# Patient Record
Sex: Male | Born: 1965 | Race: White | Hispanic: No | Marital: Married | Smoking: Never smoker
Health system: Southern US, Community
[De-identification: ages and names within clinical notes are randomized; demographics above are authoritative.]

## PROBLEM LIST (undated history)

## (undated) DIAGNOSIS — E785 Hyperlipidemia, unspecified: Secondary | ICD-10-CM

## (undated) DIAGNOSIS — F419 Anxiety disorder, unspecified: Secondary | ICD-10-CM

## (undated) DIAGNOSIS — K219 Gastro-esophageal reflux disease without esophagitis: Secondary | ICD-10-CM

## (undated) DIAGNOSIS — I2510 Atherosclerotic heart disease of native coronary artery without angina pectoris: Secondary | ICD-10-CM

## (undated) DIAGNOSIS — E7800 Pure hypercholesterolemia, unspecified: Secondary | ICD-10-CM

## (undated) DIAGNOSIS — N529 Male erectile dysfunction, unspecified: Secondary | ICD-10-CM

## (undated) DIAGNOSIS — G4700 Insomnia, unspecified: Secondary | ICD-10-CM

## (undated) DIAGNOSIS — E119 Type 2 diabetes mellitus without complications: Secondary | ICD-10-CM

## (undated) DIAGNOSIS — I10 Essential (primary) hypertension: Secondary | ICD-10-CM

## (undated) DIAGNOSIS — F39 Unspecified mood [affective] disorder: Secondary | ICD-10-CM

## (undated) DIAGNOSIS — M5126 Other intervertebral disc displacement, lumbar region: Secondary | ICD-10-CM

## (undated) HISTORY — DX: Insomnia, unspecified: G47.00

## (undated) HISTORY — DX: Unspecified mood (affective) disorder: F39

## (undated) HISTORY — DX: Hyperlipidemia, unspecified: E78.5

---

## 1999-09-27 HISTORY — PX: SHOULDER SURGERY: SHX246

## 2003-05-12 ENCOUNTER — Encounter: Payer: Self-pay | Admitting: Orthopedic Surgery

## 2003-05-12 ENCOUNTER — Ambulatory Visit (HOSPITAL_COMMUNITY): Admission: RE | Admit: 2003-05-12 | Discharge: 2003-05-12 | Payer: Self-pay | Admitting: Orthopedic Surgery

## 2008-03-20 ENCOUNTER — Ambulatory Visit: Payer: Self-pay | Admitting: Internal Medicine

## 2008-04-04 ENCOUNTER — Emergency Department: Payer: Self-pay | Admitting: Emergency Medicine

## 2008-08-14 ENCOUNTER — Encounter
Admission: RE | Admit: 2008-08-14 | Discharge: 2008-08-14 | Payer: Self-pay | Admitting: Physical Medicine & Rehabilitation

## 2008-08-22 ENCOUNTER — Emergency Department: Payer: Self-pay | Admitting: Emergency Medicine

## 2008-11-06 ENCOUNTER — Ambulatory Visit: Payer: Self-pay | Admitting: Pain Medicine

## 2008-11-11 ENCOUNTER — Ambulatory Visit: Payer: Self-pay | Admitting: Pain Medicine

## 2008-11-19 ENCOUNTER — Ambulatory Visit: Payer: Self-pay | Admitting: Physician Assistant

## 2008-12-01 ENCOUNTER — Ambulatory Visit: Payer: Self-pay | Admitting: Pain Medicine

## 2009-03-31 ENCOUNTER — Ambulatory Visit: Payer: Self-pay | Admitting: Internal Medicine

## 2010-11-09 ENCOUNTER — Ambulatory Visit: Payer: Self-pay | Admitting: Internal Medicine

## 2013-10-24 ENCOUNTER — Ambulatory Visit: Payer: Self-pay

## 2013-10-24 LAB — RAPID STREP-A WITH REFLX: Micro Text Report: NEGATIVE

## 2013-10-24 LAB — RAPID INFLUENZA A&B ANTIGENS

## 2013-10-27 LAB — BETA STREP CULTURE(ARMC)

## 2014-01-06 ENCOUNTER — Ambulatory Visit: Payer: Self-pay

## 2015-01-05 ENCOUNTER — Emergency Department
Admit: 2015-01-05 | Disposition: A | Discharge status: Home / Self Care | Payer: Self-pay | Admitting: Emergency Medicine

## 2015-01-05 LAB — BASIC METABOLIC PANEL
ANION GAP: 9 (ref 7–16)
BUN: 16 mg/dL
CALCIUM: 9.1 mg/dL
CO2: 22 mmol/L
Chloride: 102 mmol/L
Creatinine: 0.74 mg/dL
EGFR (Non-African Amer.): >60
Glucose: 251 mg/dL — ABNORMAL HIGH
Potassium: 4.3 mmol/L
Sodium: 133 mmol/L — ABNORMAL LOW

## 2015-01-05 LAB — CBC
HCT: 48.9 % (ref 40.0–52.0)
HGB: 16.9 g/dL (ref 13.0–18.0)
MCH: 32.5 pg (ref 26.0–34.0)
MCHC: 34.6 g/dL (ref 32.0–36.0)
MCV: 94 fL (ref 80–100)
Platelet: 205 10*3/uL (ref 150–440)
RBC: 5.21 10*6/uL (ref 4.40–5.90)
RDW: 12.9 % (ref 11.5–14.5)
WBC: 7 10*3/uL (ref 3.8–10.6)

## 2015-06-20 ENCOUNTER — Ambulatory Visit
Admission: EM | Admit: 2015-06-20 | Discharge: 2015-06-20 | Disposition: A | Discharge status: Home / Self Care | Payer: Managed Care, Other (non HMO) | Attending: Family Medicine | Admitting: Family Medicine

## 2015-06-20 ENCOUNTER — Encounter: Payer: Self-pay | Admitting: Emergency Medicine

## 2015-06-20 ENCOUNTER — Ambulatory Visit: Payer: Managed Care, Other (non HMO)

## 2015-06-20 DIAGNOSIS — S40011A Contusion of right shoulder, initial encounter: Secondary | ICD-10-CM

## 2015-06-20 DIAGNOSIS — S46911A Strain of unspecified muscle, fascia and tendon at shoulder and upper arm level, right arm, initial encounter: Secondary | ICD-10-CM | POA: Diagnosis not present

## 2015-06-20 HISTORY — DX: Type 2 diabetes mellitus without complications: E11.9

## 2015-06-20 HISTORY — DX: Essential (primary) hypertension: I10

## 2015-06-20 MED ORDER — KETOROLAC TROMETHAMINE 60 MG/2ML IM SOLN
60.0000 mg | Freq: Once | INTRAMUSCULAR | Status: AC
  Administered 2015-06-20: 60 mg via INTRAMUSCULAR

## 2015-06-20 MED ORDER — HYDROCODONE-ACETAMINOPHEN 5-325 MG PO TABS
1.0000 | ORAL_TABLET | Freq: Three times a day (TID) | ORAL | Status: DC | PRN
Start: 2015-06-20 — End: 2015-12-20

## 2015-06-20 NOTE — Discharge Instructions (Signed)
Shoulder Sprain °A shoulder sprain is the result of damage to the tough, fiber-like tissues (ligaments) that help hold your shoulder in place. The ligaments may be stretched or torn. Besides the main shoulder joint (the ball and socket), there are several smaller joints that connect the bones in this area. A sprain usually involves one of those joints. Most often it is the acromioclavicular (or AC) joint. That is the joint that connects the collarbone (clavicle) and the shoulder blade (scapula) at the top point of the shoulder blade (acromion). °A shoulder sprain is a mild form of what is called a shoulder separation. Recovering from a shoulder sprain may take some time. For some, pain lingers for several months. Most people recover without long term problems. °CAUSES  °· A shoulder sprain is usually caused by some kind of trauma. This might be: °¨ Falling on an outstretched arm. °¨ Being hit hard on the shoulder. °¨ Twisting the arm. °· Shoulder sprains are more likely to occur in people who: °¨ Play sports. °¨ Have balance or coordination problems. °SYMPTOMS  °· Pain when you move your shoulder. °· Limited ability to move the shoulder. °· Swelling and tenderness on top of the shoulder. °· Redness or warmth in the shoulder. °· Bruising. °· A change in the shape of the shoulder. °DIAGNOSIS  °Your healthcare provider may: °· Ask about your symptoms. °· Ask about recent activity that might have caused those symptoms. °· Examine your shoulder. You may be asked to do simple exercises to test movement. The other shoulder will be examined for comparison. °· Order some tests that provide a look inside the body. They can show the extent of the injury. The tests could include: °¨ X-rays. °¨ CT (computed tomography) scan. °¨ MRI (magnetic resonance imaging) scan. °RISKS AND COMPLICATIONS °· Loss of full shoulder motion. °· Ongoing shoulder pain. °TREATMENT  °How long it takes to recover from a shoulder sprain depends on how  severe it was. Treatment options may include: °· Rest. You should not use the arm or shoulder until it heals. °· Ice. For 2 or 3 days after the injury, put an ice pack on the shoulder up to 4 times a day. It should stay on for 15 to 20 minutes each time. Wrap the ice in a towel so it does not touch your skin. °· Over-the-counter medicine to relieve pain. °· A sling or brace. This will keep the arm still while the shoulder is healing. °· Physical therapy or rehabilitation exercises. These will help you regain strength and motion. Ask your healthcare provider when it is OK to begin these exercises. °· Surgery. The need for surgery is rare with a sprained shoulder, but some people may need surgery to keep the joint in place and reduce pain. °HOME CARE INSTRUCTIONS  °· Ask your healthcare provider about what you should and should not do while your shoulder heals. °· Make sure you know how to apply ice to the correct area of your shoulder. °· Talk with your healthcare provider about which medications should be used for pain and swelling. °· If rehabilitation therapy will be needed, ask your healthcare provider to refer you to a therapist. If it is not recommended, then ask about at-home exercises. Find out when exercise should begin. °SEEK MEDICAL CARE IF:  °Your pain, swelling, or redness at the joint increases. °SEEK IMMEDIATE MEDICAL CARE IF:  °· You have a fever. °· You cannot move your arm or shoulder. °Document Released: 01/29/2009 Document   Revised: 12/05/2011 Document Reviewed: 01/29/2009 °Exit[employer]® Patient Information ©2015 [employer]. This information is not intended to replace advice given to you by your health care provider. Make sure you discuss any questions you have with your health care provider. ° °
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
## 2015-12-20 ENCOUNTER — Encounter: Payer: Self-pay | Admitting: Emergency Medicine

## 2015-12-20 ENCOUNTER — Emergency Department: Payer: Managed Care, Other (non HMO)

## 2015-12-20 ENCOUNTER — Emergency Department
Admission: EM | Admit: 2015-12-20 | Discharge: 2015-12-20 | Disposition: A | Discharge status: Home / Self Care | Payer: Managed Care, Other (non HMO) | Attending: Student | Admitting: Student

## 2015-12-20 DIAGNOSIS — E119 Type 2 diabetes mellitus without complications: Secondary | ICD-10-CM | POA: Insufficient documentation

## 2015-12-20 DIAGNOSIS — Z7984 Long term (current) use of oral hypoglycemic drugs: Secondary | ICD-10-CM | POA: Diagnosis not present

## 2015-12-20 DIAGNOSIS — Y9301 Activity, walking, marching and hiking: Secondary | ICD-10-CM | POA: Insufficient documentation

## 2015-12-20 DIAGNOSIS — S2231XA Fracture of one rib, right side, initial encounter for closed fracture: Secondary | ICD-10-CM | POA: Diagnosis not present

## 2015-12-20 DIAGNOSIS — Y9289 Other specified places as the place of occurrence of the external cause: Secondary | ICD-10-CM | POA: Diagnosis not present

## 2015-12-20 DIAGNOSIS — Y998 Other external cause status: Secondary | ICD-10-CM | POA: Diagnosis not present

## 2015-12-20 DIAGNOSIS — Z791 Long term (current) use of non-steroidal anti-inflammatories (NSAID): Secondary | ICD-10-CM | POA: Diagnosis not present

## 2015-12-20 DIAGNOSIS — Z79899 Other long term (current) drug therapy: Secondary | ICD-10-CM | POA: Insufficient documentation

## 2015-12-20 DIAGNOSIS — I10 Essential (primary) hypertension: Secondary | ICD-10-CM | POA: Insufficient documentation

## 2015-12-20 DIAGNOSIS — W010XXA Fall on same level from slipping, tripping and stumbling without subsequent striking against object, initial encounter: Secondary | ICD-10-CM | POA: Diagnosis not present

## 2015-12-20 DIAGNOSIS — S29001A Unspecified injury of muscle and tendon of front wall of thorax, initial encounter: Secondary | ICD-10-CM | POA: Diagnosis present

## 2015-12-20 MED ORDER — HYDROCODONE-ACETAMINOPHEN 5-325 MG PO TABS
2.0000 | ORAL_TABLET | Freq: Once | ORAL | Status: AC
  Administered 2015-12-20: 2 via ORAL
  Filled 2015-12-20: qty 2

## 2015-12-20 MED ORDER — HYDROCODONE-ACETAMINOPHEN 5-325 MG PO TABS: 1.0000 | ORAL_TABLET | ORAL | Status: DC | PRN

## 2015-12-20 NOTE — Discharge Instructions (Signed)
Rib Fracture A rib fracture is a break or crack in one of the bones of the ribs. The ribs are like a cage that goes around your upper chest. A broken or cracked rib is often painful, but most do not cause other problems. Most rib fractures heal on their own in 1-3 months. HOME CARE  Avoid activities that cause pain to the injured area. Protect your injured area.  Slowly increase activity as told by your doctor.  Take medicine as told by your doctor.  Put ice on the injured area for the first 1-2 days after you have been treated or as told by your doctor.  Put ice in a plastic bag.  Place a towel between your skin and the bag.  Leave the ice on for 15-20 minutes at a time, every 2 hours while you are awake.  Do deep breathing as told by your doctor. You may be told to:  Take deep breaths many times a day.  Cough many times a day while hugging a pillow.  Use a device (incentive spirometer) to perform deep breathing many times a day.  Drink enough fluids to keep your pee (urine) clear or pale yellow.   Do not wear a rib belt or binder. These do not allow you to breathe deeply. GET HELP RIGHT AWAY IF:   You have a fever.  You have trouble breathing.   You cannot stop coughing.  You cough up thick or bloody spit (mucus).   You feel sick to your stomach (nauseous), throw up (vomit), or have belly (abdominal) pain.   Your pain gets worse and medicine does not help.  MAKE SURE YOU:   Understand these instructions.  Will watch your condition.  Will get help right away if you are not doing well or get worse.   This information is not intended to replace advice given to you by your health care provider. Make sure you discuss any questions you have with your health care provider.   Document Released: 06/21/2008 Document Revised: 01/07/2013 Document Reviewed: 11/14/2012 Elsevier Interactive Patient Education 2016 [employer]   Continue taking ibuprofen for  inflammation. Norco as needed for severe pain.  Be aware that you cannot take pain medication while driving or working.  ice to area as needed for pain.  Use a pillow against your chest to take deep breaths and cough once an hour to prevent pneumonia

## 2015-12-20 NOTE — ED Notes (Signed)
Patient presents to the ED with right sided rib pain after tripping over a log yesterday evening.  Patient reports some pain when he breathes.  Patient is speaking in full sentences with no obvious distress.

## 2015-12-20 NOTE — ED Notes (Signed)
Patient transported to X-ray 

## 2015-12-20 NOTE — ED Notes (Signed)
Patient states rib pain started after 10pm when he tripped over a log and landed on his right side.  Patient c/o pain when he take a deep breath, but he is able to fully inhale and exhale.  No bruising noted at this time.  Patient points to right lower rib cage near his side as the area of discomfort.  Patient is in NAD at this time.

## 2015-12-20 NOTE — ED Provider Notes (Signed)
[facility] Emergency Department Provider Note  ____________________________________________  Time seen: Approximately 8:40 AM  I have reviewed the triage vital signs and the nursing notes.   HISTORY  Chief Complaint Fall   HPI Matthew Blevins is a 50 y.o. male is here complaining of right lateral chest wall pain. Patient states that he tripped over a long walk at approximately 10 PM last night and has had problems taking deep breaths and rib pain since that time.  He states he has not seen a bruise in the area. He should has never been a smoker in the past. Currently his pain is 10 over 10. Patient did take ibuprofen prior to his arrival in the emergency room.   Past Medical History  Diagnosis Date  . Diabetes mellitus without complication (HCC)   . Hypertension     There are no active problems to display for this patient.   Past Surgical History  Procedure Laterality Date  . Shoulder surgery Left 2001    tissue removed from "hood" of shoulder    Current Outpatient Rx  Name  Route  Sig  Dispense  Refill  . citalopram (CELEXA) 10 MG tablet   Oral   Take 10 mg by mouth daily.         Marland Kitchen. ibuprofen (ADVIL,MOTRIN) 800 MG tablet   Oral   Take 800 mg by mouth daily.         Marland Kitchen. lisinopril (PRINIVIL,ZESTRIL) 10 MG tablet   Oral   Take 10 mg by mouth daily.         . metFORMIN (GLUCOPHAGE) 500 MG tablet   Oral   Take 1,000 mg by mouth 2 (two) times daily with a meal.         . omeprazole (PRILOSEC) 20 MG capsule   Oral   Take 20 mg by mouth daily.         Marland Kitchen. HYDROcodone-acetaminophen (NORCO/VICODIN) 5-325 MG tablet   Oral   Take 1 tablet by mouth every 4 (four) hours as needed for moderate pain.   20 tablet   0     Allergies Review of patient's allergies indicates no known allergies.  No family history on file.  Social History Social History  Substance Use Topics  . Smoking status: Never Smoker   . Smokeless tobacco:  Never Used  . Alcohol Use: Yes    Review of Systems Constitutional: No fever/chills Cardiovascular: Denies chest pain. Respiratory: Denies shortness of breath. Gastrointestinal: No abdominal pain.  Positive nausea, no vomiting.   Musculoskeletal: Negative for back pain. Positive right lateral rib pain  Skin: Negative for rash. Neurological: Negative for headaches, focal weakness or numbness.  10-point ROS otherwise negative.  ____________________________________________   PHYSICAL EXAM:  VITAL SIGNS: ED Triage Vitals  Enc Vitals Group     BP 12/20/15 0757 133/74 mmHg     Pulse Rate 12/20/15 0757 98     Resp 12/20/15 0757 18     Temp 12/20/15 0757 97.8 F (36.6 C)     Temp Source 12/20/15 0757 Oral     SpO2 12/20/15 0757 100 %     Weight 12/20/15 0757 190 lb (86.183 kg)     Height 12/20/15 0757 5\' 10"  (1.778 m)     Head Cir --      Peak Flow --      Pain Score --      Pain Loc --      Pain Edu? --  Excl. in GC? --     Constitutional: Alert and oriented. Well appearing and in no acute distress. Eyes: Conjunctivae are normal. PERRL. EOMI. Head: Atraumatic. Nose: No congestion/rhinnorhea. Mouth/Throat: Mucous membranes are moist.  Oropharynx non-erythematous. Neck: No stridor.   Cardiovascular: Normal rate, regular rhythm. Grossly normal heart sounds.  Good peripheral circulation. Respiratory: Normal respiratory effort.  No retractions. Lungs CTAB.  There is marked tenderness on palpation of the right lateral lower ribs approximately 8, 9, 10 and 11. No deformity is noted and no crepitus. Gastrointestinal: Soft and nontender. Musculoskeletal: Moves upper and lower extremities without any difficulty. See note above in respiratory. Questionable rib fracture. Neurologic:  Normal speech and language. No gross focal neurologic deficits are appreciated. No gait instability. Skin:  Skin is warm, dry and intact. No ecchymosis, abrasions, erythema. Psychiatric: Mood and  affect are normal. Speech and behavior are normal.  ____________________________________________   LABS (all labs ordered are listed, but only abnormal results are displayed)  Labs Reviewed - No data to display   RADIOLOGY  Right rib and chest x-ray shows nondisplaced fracture of the right 11th rib per radiologist. I, Tommi Rumps, personally viewed and evaluated these images (plain radiographs) as part of my medical decision making, as well as reviewing the written report by the radiologist. ____________________________________________   PROCEDURES  Procedure(s) performed: None  Critical Care performed: No  ____________________________________________   INITIAL IMPRESSION / ASSESSMENT AND PLAN / ED COURSE  Pertinent labs & imaging results that were available during my care of the patient were reviewed by me and considered in my medical decision making (see chart for details).  Patient was made aware that he does have a rib fracture. He was placed on pain medication and told he could not take this to drive or work. He is also encouraged to take deep breaths every hour or place a pleasant over his ribs for support when he coughs or sneezes. Follow-up with his primary care doctor if any continued problems. ____________________________________________   FINAL CLINICAL IMPRESSION(S) / ED DIAGNOSES  Final diagnoses:  Closed fracture of rib of right side, initial encounter      Tommi Rumps, PA-C 12/20/15 1043  Gayla Doss, MD 12/20/15 1233

## 2016-08-28 ENCOUNTER — Encounter: Payer: Self-pay | Admitting: Gynecology

## 2016-08-28 ENCOUNTER — Ambulatory Visit
Admission: EM | Admit: 2016-08-28 | Discharge: 2016-08-28 | Disposition: A | Discharge status: Home / Self Care | Payer: Managed Care, Other (non HMO) | Attending: Family Medicine | Admitting: Family Medicine

## 2016-08-28 DIAGNOSIS — L03032 Cellulitis of left toe: Secondary | ICD-10-CM

## 2016-08-28 HISTORY — DX: Pure hypercholesterolemia, unspecified: E78.00

## 2016-08-28 HISTORY — DX: Gastro-esophageal reflux disease without esophagitis: K21.9

## 2016-08-28 HISTORY — DX: Other intervertebral disc displacement, lumbar region: M51.26

## 2016-08-28 HISTORY — DX: Male erectile dysfunction, unspecified: N52.9

## 2016-08-28 LAB — URINALYSIS COMPLETE WITH MICROSCOPIC (ARMC ONLY)
BACTERIA UA: NONE SEEN
Bilirubin Urine: NEGATIVE
GLUCOSE, UA: 500 mg/dL — AB
HGB URINE DIPSTICK: NEGATIVE
Ketones, ur: NEGATIVE mg/dL
Leukocytes, UA: NEGATIVE
NITRITE: NEGATIVE
Specific Gravity, Urine: 1.02 (ref 1.005–1.030)
pH: 6.5 (ref 5.0–8.0)

## 2016-08-28 LAB — GLUCOSE, CAPILLARY: GLUCOSE-CAPILLARY: 230 mg/dL — AB (ref 65–99)

## 2016-08-28 MED ORDER — CEPHALEXIN 500 MG PO CAPS: 500.0000 mg | ORAL_CAPSULE | Freq: Four times a day (QID) | ORAL | 0 refills | Status: DC

## 2016-08-28 NOTE — ED Triage Notes (Signed)
Patient c/o sugar high at home of 265. Patient also c/o of odor to urine and left big toe redness x 1 week.

## 2016-08-28 NOTE — ED Provider Notes (Signed)
[facility]    CSN: [id number] Arrival date & time: 08/28/16  1009     History   Chief Complaint Chief Complaint  Patient presents with  . Blood Sugar Problem  . Urinary Tract Infection  . Nail Problem    HPI Matthew Blevins is a 50 y.o. male.   50 yo diabetic male presents with a c/o left big toe pain for 2 weeks, since his dog stepped on his toe and scratched with his nail. Patient denies any fevers, chills, drainage. However, states his blood sugars have been high and urine smell has been very strong recently.    The history is provided by the patient.    Past Medical History:  Diagnosis Date  . Diabetes mellitus without complication (HCC)   . ED (erectile dysfunction)   . GERD (gastroesophageal reflux disease)   . High cholesterol   . Hypertension   . Lumbar herniated disc     There are no active problems to display for this patient.   Past Surgical History:  Procedure Laterality Date  . SHOULDER SURGERY Left 2001   tissue removed from "hood" of shoulder       Home Medications    Prior to Admission medications   Medication Sig Start Date End Date Taking? Authorizing Provider  citalopram (CELEXA) 10 MG tablet Take 10 mg by mouth daily.   Yes Historical Provider, MD  HYDROcodone-acetaminophen (NORCO/VICODIN) 5-325 MG tablet Take 1 tablet by mouth every 4 (four) hours as needed for moderate pain. 12/20/15  Yes Tommi Rumpshonda L Summers, PA-C  ibuprofen (ADVIL,MOTRIN) 800 MG tablet Take 800 mg by mouth daily.   Yes Historical Provider, MD  lisinopril (PRINIVIL,ZESTRIL) 10 MG tablet Take 10 mg by mouth daily.   Yes Historical Provider, MD  metFORMIN (GLUCOPHAGE) 500 MG tablet Take 1,000 mg by mouth 2 (two) times daily with a meal.   Yes Historical Provider, MD  omeprazole (PRILOSEC) 20 MG capsule Take 20 mg by mouth daily.   Yes Historical Provider, MD  cephALEXin (KEFLEX) 500 MG capsule Take 1 capsule (500 mg total) by mouth 4 (four) times daily. 08/28/16    Payton Mccallumrlando Demetrius Barrell, MD  simvastatin (ZOCOR) 40 MG tablet Take 40 mg by mouth daily.    Historical Provider, MD    Family History No family history on file.  Social History Social History  Substance Use Topics  . Smoking status: Never Smoker  . Smokeless tobacco: Never Used  . Alcohol use Yes     Allergies   Patient has no known allergies.   Review of Systems Review of Systems   Physical Exam Triage Vital Signs ED Triage Vitals  Enc Vitals Group     BP 08/28/16 1123 127/86     Pulse Rate 08/28/16 1123 78     Resp 08/28/16 1123 16     Temp 08/28/16 1123 98 F (36.7 C)     Temp Source 08/28/16 1123 Oral     SpO2 08/28/16 1123 99 %     Weight 08/28/16 1121 178 lb (80.7 kg)     Height 08/28/16 1121 5\' 8"  (1.727 m)     Head Circumference --      Peak Flow --      Pain Score 08/28/16 1123 0     Pain Loc --      Pain Edu? --      Excl. in GC? --    No data found.   Updated Vital Signs BP 127/86 (BP Location:  Left Arm)   Pulse 78   Temp 98 F (36.7 C) (Oral)   Resp 16   Ht 5\' 8"  (1.727 m)   Wt 178 lb (80.7 kg)   SpO2 99%   BMI 27.06 kg/m   Visual Acuity Right Eye Distance:   Left Eye Distance:   Bilateral Distance:    Right Eye Near:   Left Eye Near:    Bilateral Near:     Physical Exam  Constitutional: He appears well-developed and well-nourished. No distress.  Skin: He is not diaphoretic. There is erythema.  Left big toe skin with blanchable erythema, warmth and tenderness to palpation around the lateral nail edge; mild edema noted around the area as well; no drainage  Nursing note and vitals reviewed.    UC Treatments / Results  Labs (all labs ordered are listed, but only abnormal results are displayed) Labs Reviewed  URINALYSIS COMPLETEWITH MICROSCOPIC ([facility] ONLY) - Abnormal; Notable for the following:       Result Value   Glucose, UA 500 (*)    Protein, ur TRACE (*)    Squamous Epithelial / LPF 0-5 (*)    All other components within  normal limits  GLUCOSE, CAPILLARY - Abnormal; Notable for the following:    Glucose-Capillary 230 (*)    All other components within normal limits    EKG  EKG Interpretation None       Radiology No results found.  Procedures Procedures (including critical care time)  Medications Ordered in UC Medications - No data to display   Initial Impression / Assessment and Plan / UC Course  I have reviewed the triage vital signs and the nursing notes.  Pertinent labs & imaging results that were available during my care of the patient were reviewed by me and considered in my medical decision making (see chart for details).  Clinical Course       Final Clinical Impressions(s) / UC Diagnoses   Final diagnoses:  Cellulitis of left toe    New Prescriptions Discharge Medication List as of 08/28/2016 11:43 AM    START taking these medications   Details  cephALEXin (KEFLEX) 500 MG capsule Take 1 capsule (500 mg total) by mouth 4 (four) times daily., Starting Sun 08/28/2016, Normal       1. Lab (UA) results and diagnosis reviewed with patient 2. rx as per orders above; reviewed possible side effects, interactions, risks and benefits  3. Recommend supportive treatment with warm compresses to toe 4. Recommend follow up with PCP regarding uncontrolled diabetes (explained to patient that he's past due for a follow up with his PCP for his diabetes) 5. Follow-up prn if symptoms worsen or don't improve   Payton Mccallumrlando Palak Tercero, MD 08/28/16 1156

## 2017-03-13 ENCOUNTER — Encounter: Payer: Self-pay | Admitting: Emergency Medicine

## 2017-03-13 ENCOUNTER — Emergency Department
Admission: EM | Admit: 2017-03-13 | Discharge: 2017-03-13 | Disposition: A | Discharge status: Home / Self Care | Payer: Commercial Managed Care - PPO | Attending: Emergency Medicine | Admitting: Emergency Medicine

## 2017-03-13 ENCOUNTER — Emergency Department: Payer: Commercial Managed Care - PPO

## 2017-03-13 DIAGNOSIS — R079 Chest pain, unspecified: Secondary | ICD-10-CM | POA: Diagnosis not present

## 2017-03-13 DIAGNOSIS — Z7984 Long term (current) use of oral hypoglycemic drugs: Secondary | ICD-10-CM | POA: Diagnosis not present

## 2017-03-13 DIAGNOSIS — E119 Type 2 diabetes mellitus without complications: Secondary | ICD-10-CM | POA: Insufficient documentation

## 2017-03-13 DIAGNOSIS — Z791 Long term (current) use of non-steroidal anti-inflammatories (NSAID): Secondary | ICD-10-CM | POA: Insufficient documentation

## 2017-03-13 DIAGNOSIS — Z7982 Long term (current) use of aspirin: Secondary | ICD-10-CM | POA: Diagnosis not present

## 2017-03-13 DIAGNOSIS — I10 Essential (primary) hypertension: Secondary | ICD-10-CM | POA: Diagnosis not present

## 2017-03-13 LAB — HEPATIC FUNCTION PANEL
ALK PHOS: 69 U/L (ref 38–126)
ALT: 31 U/L (ref 17–63)
AST: 29 U/L (ref 15–41)
Albumin: 4.2 g/dL (ref 3.5–5.0)
BILIRUBIN TOTAL: 0.9 mg/dL (ref 0.3–1.2)
TOTAL PROTEIN: 7.4 g/dL (ref 6.5–8.1)

## 2017-03-13 LAB — CBC
HCT: 44.3 % (ref 40.0–52.0)
Hemoglobin: 15.7 g/dL (ref 13.0–18.0)
MCH: 32.5 pg (ref 26.0–34.0)
MCHC: 35.4 g/dL (ref 32.0–36.0)
MCV: 91.7 fL (ref 80.0–100.0)
PLATELETS: 195 10*3/uL (ref 150–440)
RBC: 4.83 MIL/uL (ref 4.40–5.90)
RDW: 12.8 % (ref 11.5–14.5)
WBC: 6.3 10*3/uL (ref 3.8–10.6)

## 2017-03-13 LAB — BASIC METABOLIC PANEL
Anion gap: 7 (ref 5–15)
BUN: 12 mg/dL (ref 6–20)
CHLORIDE: 100 mmol/L — AB (ref 101–111)
CO2: 25 mmol/L (ref 22–32)
Calcium: 9 mg/dL (ref 8.9–10.3)
Creatinine, Ser: 0.8 mg/dL (ref 0.61–1.24)
GFR calc Af Amer: >60 mL/min (ref >60)
GFR calc non Af Amer: >60 mL/min (ref >60)
GLUCOSE: 348 mg/dL — AB (ref 65–99)
Potassium: 4.1 mmol/L (ref 3.5–5.1)
SODIUM: 132 mmol/L — AB (ref 135–145)

## 2017-03-13 LAB — TROPONIN I
Troponin I: <0.03 ng/mL (ref <0.03)
Troponin I: <0.03 ng/mL (ref <0.03)

## 2017-03-13 LAB — BRAIN NATRIURETIC PEPTIDE: B Natriuretic Peptide: 26 pg/mL (ref 0.0–100.0)

## 2017-03-13 LAB — FIBRIN DERIVATIVES D-DIMER (ARMC ONLY): FIBRIN DERIVATIVES D-DIMER (ARMC): 290.8 (ref 0.00–499.00)

## 2017-03-13 LAB — LIPASE, BLOOD: Lipase: 30 U/L (ref 11–51)

## 2017-03-13 NOTE — Discharge Instructions (Signed)
Return to the emergency room for any new or worrisome symptoms including but not limited to chest pain, shortness of breath, or you feel worse in any way. We have advised either admission or close follow-up with cardiology tomorrow. He would prefer not to be admitted, we have given you instructions for cardiology follow-up. Please do so and follow up as well with pcp. Please return to the ED if you feel worse or have any other concerns

## 2017-03-13 NOTE — ED Provider Notes (Addendum)
[facility] Emergency Department Provider Note  ____________________________________________   I have reviewed the triage vital signs and the nursing notes.   HISTORY  Chief Complaint Chest Pain    HPI Matthew Blevins is a 51 y.o. male  who presents today complaining of right-sided chest pain. Happens when he is mowing the lawn and only when he is mowing the lawn. He does lots of exertional exercises otherwise at work and other places and has no chest pain but when he is pushing the lawnmower it is uncomfortable in his right chest. He denies any shortness of breath to me. No personal or family history of PE or DVT. No leg swelling no recent travel. Patient has no pain at this time. He can reproduce the pain by moving the wrong way however. Patient has had extensive history on that right side he did fall and break ribs there once, he has also been seen for a shoulder sprain on the right. He does not have any recollected trauma. However, when he is pushing a lawnmower it is uncomfortable on the right. He has no chest pain at this time. It stops when he stops mowing the lawn. Denies stated he otherwise has no exertional symptoms. He states that he does activities of equal exertional output with no discomfort. Pain does not radiate its a sharp pain, it is worse when he pushes the lawnmower and better when he stops. Nothing else makes it better or worse. He is not having any at this time. The radiation. No other associated symptoms.  Patient is a history of hypertension diabetes, he is not compliant with his metformin has normally elevated glucoses which he does not check   Past Medical History:  Diagnosis Date  . Diabetes mellitus without complication (HCC)   . ED (erectile dysfunction)   . GERD (gastroesophageal reflux disease)   . High cholesterol   . Hypertension   . Lumbar herniated disc     There are no active problems to display for this patient.   Past  Surgical History:  Procedure Laterality Date  . SHOULDER SURGERY Left 2001   tissue removed from "hood" of shoulder    Prior to Admission medications   Medication Sig Start Date End Date Taking? Authorizing Provider  cephALEXin (KEFLEX) 500 MG capsule Take 1 capsule (500 mg total) by mouth 4 (four) times daily. 08/28/16   Payton Mccallum, MD  citalopram (CELEXA) 10 MG tablet Take 10 mg by mouth daily.    [provider]  HYDROcodone-acetaminophen (NORCO/VICODIN) 5-325 MG tablet Take 1 tablet by mouth every 4 (four) hours as needed for moderate pain. 12/20/15   Tommi Rumps, PA-C  ibuprofen (ADVIL,MOTRIN) 800 MG tablet Take 800 mg by mouth daily.    [provider]  lisinopril (PRINIVIL,ZESTRIL) 10 MG tablet Take 10 mg by mouth daily.    [provider]  metFORMIN (GLUCOPHAGE) 500 MG tablet Take 1,000 mg by mouth 2 (two) times daily with a meal.    [provider]  omeprazole (PRILOSEC) 20 MG capsule Take 20 mg by mouth daily.    [provider]  simvastatin (ZOCOR) 40 MG tablet Take 40 mg by mouth daily.    [provider]    Allergies Patient has no known allergies.  No family history on file.  Social History Social History  Substance Use Topics  . Smoking status: Never Smoker  . Smokeless tobacco: Never Used  . Alcohol use Yes    Review  of Systems Constitutional: No fever/chills Eyes: No visual changes. ENT: No sore throat. No stiff neck no neck pain Cardiovascular: + chest pain. Respiratory: Denies shortness of breath. Gastrointestinal:   no vomiting.  No diarrhea.  No constipation. Genitourinary: Negative for dysuria. Musculoskeletal: Negative lower extremity swelling Skin: Negative for rash. Neurological: Negative for severe headaches, focal weakness or numbness.   ____________________________________________   PHYSICAL EXAM:  VITAL SIGNS: ED Triage Vitals  Enc Vitals Group     BP 03/13/17 1044 140/80      Pulse Rate 03/13/17 1044 78     Resp 03/13/17 1044 18     Temp 03/13/17 1044 97.6 F (36.4 C)     Temp Source 03/13/17 1044 Oral     SpO2 03/13/17 1044 99 %     Weight 03/13/17 1041 183 lb (83 kg)     Height 03/13/17 1041 5' 10.5" (1.791 m)     Head Circumference --      Peak Flow --      Pain Score 03/13/17 1041 10     Pain Loc --      Pain Edu? --      Excl. in GC? --     Constitutional: Alert and oriented. Well appearing and in no acute distress. Eyes: Conjunctivae are normal Head: Atraumatic HEENT: No congestion/rhinnorhea. Mucous membranes are moist.  Oropharynx non-erythematous Neck:   Nontender with no meningismus, no masses, no stridor Chest: Minimal tenderness to palpation or chest wall seems to reproduce his pain Cardiovascular: Normal rate, regular rhythm. Grossly normal heart sounds.  Good peripheral circulation. Respiratory: Normal respiratory effort.  No retractions. Lungs CTAB. Abdominal: Soft and nontender. No distention. No guarding no rebound Back:  There is no focal tenderness or step off.  there is no midline tenderness there are no lesions noted. there is no CVA tenderness Musculoskeletal: No lower extremity tenderness, no upper extremity tenderness. No joint effusions, no DVT signs strong distal pulses no edema Neurologic:  Normal speech and language. No gross focal neurologic deficits are appreciated.  Skin:  Skin is warm, dry and intact. No rash noted. Psychiatric: Mood and affect are normal. Speech and behavior are normal.  ____________________________________________   LABS (all labs ordered are listed, but only abnormal results are displayed)  Labs Reviewed  BASIC METABOLIC PANEL - Abnormal; Notable for the following:       Result Value   Sodium 132 (*)    Chloride 100 (*)    Glucose, Bld 348 (*)    All other components within normal limits  CBC  TROPONIN I  BRAIN NATRIURETIC PEPTIDE  LIPASE, BLOOD  HEPATIC FUNCTION PANEL  FIBRIN DERIVATIVES  D-DIMER ([facility] ONLY)   ____________________________________________  EKG  I personally interpreted any EKGs ordered by me or triage Normal sinus rhythm rate 74 bpm no acute ST elevation or acute ST depression normal axis ____________________________________________  RADIOLOGY  I reviewed any imaging ordered by me or triage that were performed during my shift and, if possible, patient and/or family made aware of any abnormal findings. ____________________________________________   PROCEDURES  Procedure(s) performed: None  Procedures  Critical Care performed: None  ____________________________________________   INITIAL IMPRESSION / ASSESSMENT AND PLAN / ED COURSE  Pertinent labs & imaging results that were available during my care of the patient were reviewed by me and considered in my medical decision making (see chart for details).  Patient has a history of chest pain with a certain kind of activity which involves using his right  upper arm. Other activities do not seem to elicit the chest pain. He does however have risk factors for CAD. We will send a second troponin, low suspicion for PE but we will send a d-dimer, this is right-sided pain although he does not have right-sided abdominal pain we'll do hepatic function panel and lipase. Chest x-ray is nonspecific. He denies cough, there is nothing to suggest that the patient has heart failure BNP is negative. We will continue to observe the patient here and sent second cardiac enzymes and reassess  ----------------------------------------- 3:30 PM on 03/13/2017 -----------------------------------------  Patient has no tenderness or pain at this time. He is eager to go home. I did discuss with him the fact that I cannot rule out heart disease with any of the tests that I have done a can only evaluate for damage to the heart. He understands this. He understands the need for a stress test. I have offered and recommended admission and  he would prefer to go home. I have offered and recommended close outpatient follow-up tomorrow and suggested that I can get him a work note and an appointment, he states that he would like a referral but would not need to make an appointment. Obviously, he had a sense risk benefits alternatives all these courses of action. Patient believes that he has pain from his old injury to his shoulder that is activated by mowing the lawn by pushing the mower and he states he is able to exert himself completely including playing golf and "running around" at work with no difficulty. He understands that if anytime he chooses to come back to the emergency department especially if he has pain or other symptoms she is encouraged to do so. Return precautions and follow-up given and understood.    ____________________________________________   FINAL CLINICAL IMPRESSION(S) / ED DIAGNOSES  Final diagnoses:  None      This chart was dictated using voice recognition software.  Despite best efforts to proofread,  errors can occur which can change meaning.      Jeanmarie PlantMcShane, Emry Tobin A, MD 03/13/17 1356    Jeanmarie PlantMcShane, Geo Slone A, MD 03/13/17 [id number]

## 2017-03-13 NOTE — ED Triage Notes (Signed)
Pt reports non radiating right side chest pain for three days. Pt reports feeling a tightening on his lungs and shortness of breath. Pt in no apparent distress in triage.

## 2017-06-25 IMAGING — CR DG RIBS W/ CHEST 3+V*R*
1 series · 3 of 3 positions shown · non-contrast
Comparison: None.

CLINICAL DATA: Pt reports falling over a log yesterday, c/o right
rib pain, TTP at BB, no bruising; shielded.

EXAM:
RIGHT RIBS AND CHEST - 3+ VIEW

[Series 1: dg ribs unilateral w/chest right · 0.14mm/px · 3 of 3 slices shown]
[im 1/3]
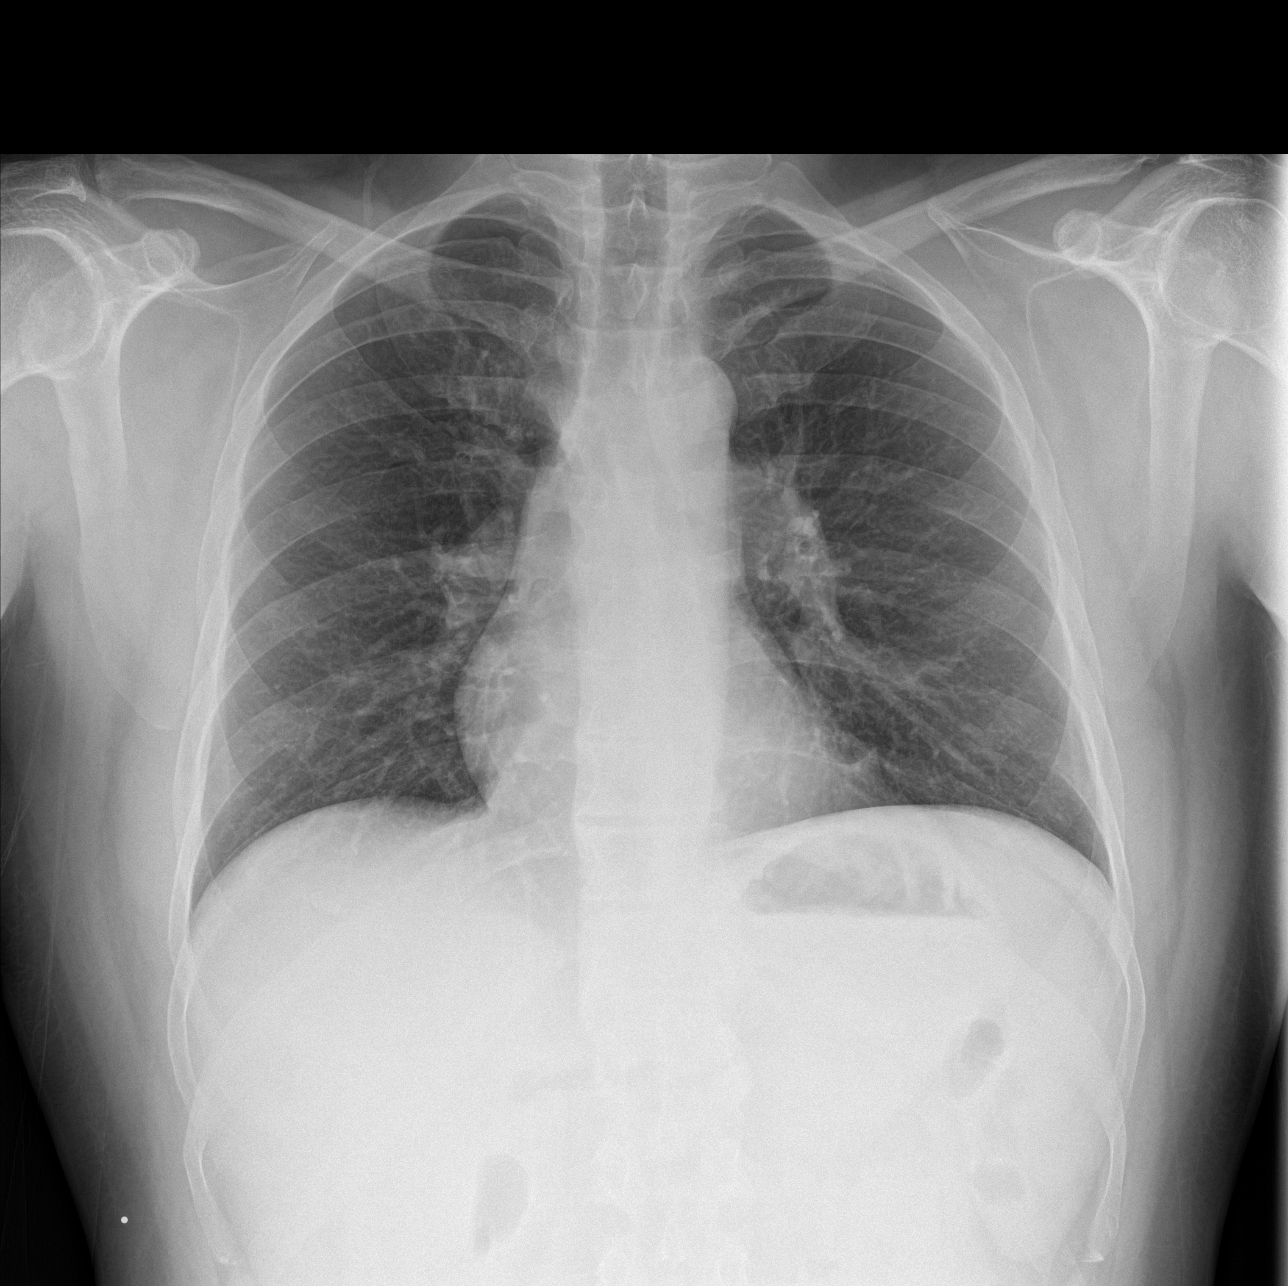
[im 2/3]
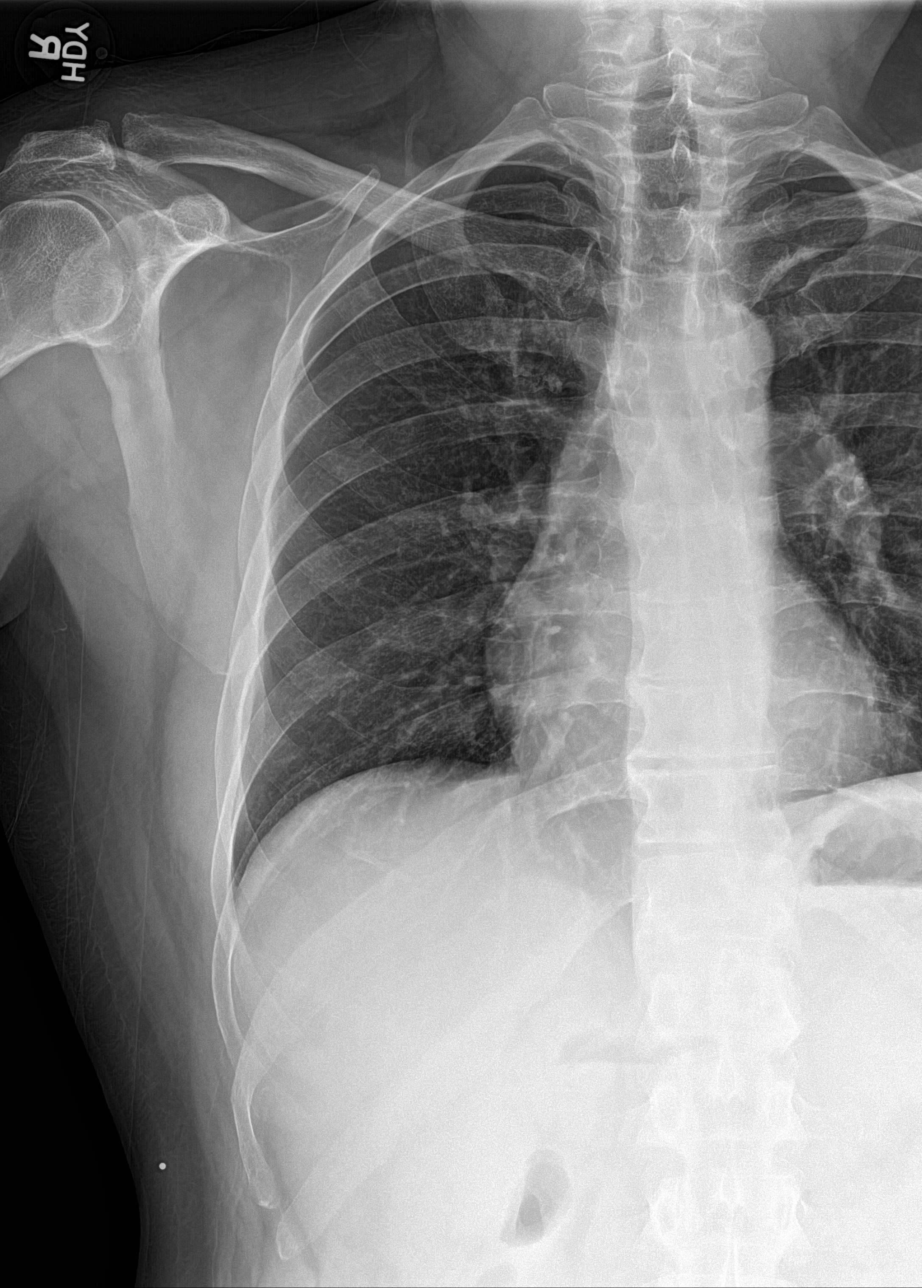
[im 3/3]
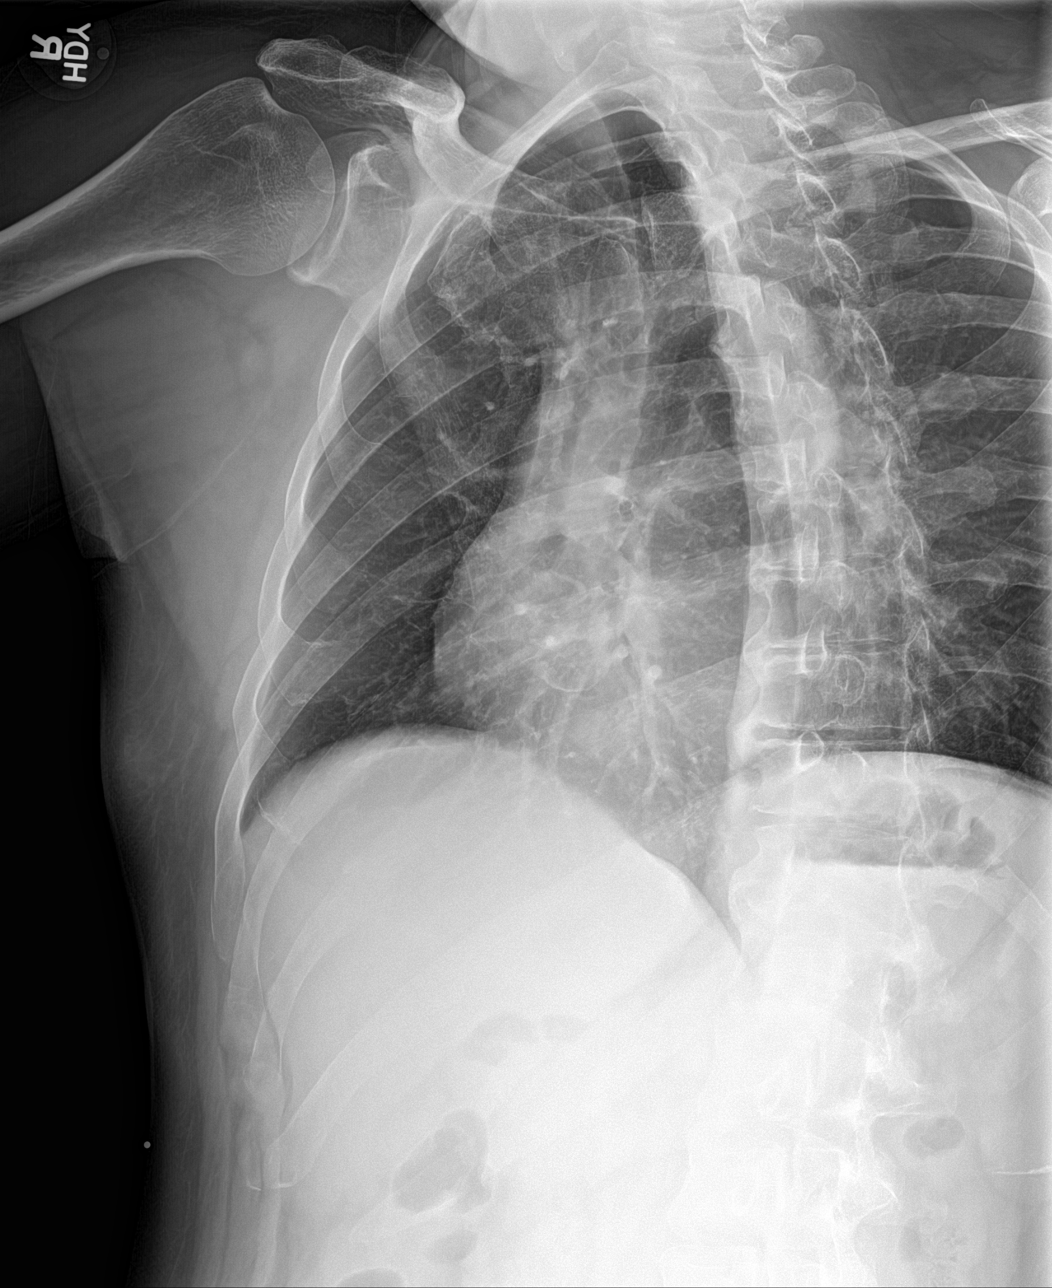

[3 of 3 positions shown; findings below may reference images not displayed]

FINDINGS: Normal cardiac silhouette. No pulmonary contusion or pneumothorax.
There is subtle angulation of the RIGHT lateral eleventh rib. This
could represent a nondisplaced fracture.
IMPRESSION: Potential nondisplaced fracture of the RIGHT eleventh rib. No
pneumothorax.

## 2019-04-09 ENCOUNTER — Other Ambulatory Visit: Payer: Self-pay

## 2019-04-09 ENCOUNTER — Inpatient Hospital Stay: Admit: 2019-04-09 | Payer: Commercial Managed Care - PPO

## 2019-04-09 ENCOUNTER — Encounter
Admission: EM | Disposition: A | Discharge status: Home / Self Care | Payer: Self-pay | Source: Home / Self Care | Attending: Internal Medicine

## 2019-04-09 ENCOUNTER — Inpatient Hospital Stay
Admission: EM | Admit: 2019-04-09 | Discharge: 2019-04-11 | DRG: 281 | Disposition: A | Discharge status: Home / Self Care | Payer: Commercial Managed Care - PPO | Attending: Internal Medicine | Admitting: Internal Medicine

## 2019-04-09 ENCOUNTER — Inpatient Hospital Stay (HOSPITAL_COMMUNITY)
Admit: 2019-04-09 | Discharge: 2019-04-09 | Disposition: A | Discharge status: Home / Self Care | Payer: Commercial Managed Care - PPO | Attending: Internal Medicine | Admitting: Internal Medicine

## 2019-04-09 ENCOUNTER — Emergency Department: Payer: Commercial Managed Care - PPO

## 2019-04-09 DIAGNOSIS — Z823 Family history of stroke: Secondary | ICD-10-CM

## 2019-04-09 DIAGNOSIS — I5030 Unspecified diastolic (congestive) heart failure: Secondary | ICD-10-CM | POA: Diagnosis not present

## 2019-04-09 DIAGNOSIS — E785 Hyperlipidemia, unspecified: Secondary | ICD-10-CM

## 2019-04-09 DIAGNOSIS — I10 Essential (primary) hypertension: Secondary | ICD-10-CM | POA: Diagnosis not present

## 2019-04-09 DIAGNOSIS — E781 Pure hyperglyceridemia: Secondary | ICD-10-CM | POA: Diagnosis present

## 2019-04-09 DIAGNOSIS — I110 Hypertensive heart disease with heart failure: Secondary | ICD-10-CM | POA: Diagnosis present

## 2019-04-09 DIAGNOSIS — I214 Non-ST elevation (NSTEMI) myocardial infarction: Principal | ICD-10-CM

## 2019-04-09 DIAGNOSIS — I5020 Unspecified systolic (congestive) heart failure: Secondary | ICD-10-CM | POA: Diagnosis present

## 2019-04-09 DIAGNOSIS — E876 Hypokalemia: Secondary | ICD-10-CM | POA: Diagnosis present

## 2019-04-09 DIAGNOSIS — R079 Chest pain, unspecified: Secondary | ICD-10-CM

## 2019-04-09 DIAGNOSIS — Z8249 Family history of ischemic heart disease and other diseases of the circulatory system: Secondary | ICD-10-CM

## 2019-04-09 DIAGNOSIS — Z794 Long term (current) use of insulin: Secondary | ICD-10-CM | POA: Diagnosis not present

## 2019-04-09 DIAGNOSIS — I25110 Atherosclerotic heart disease of native coronary artery with unstable angina pectoris: Secondary | ICD-10-CM | POA: Diagnosis present

## 2019-04-09 DIAGNOSIS — I2510 Atherosclerotic heart disease of native coronary artery without angina pectoris: Secondary | ICD-10-CM

## 2019-04-09 DIAGNOSIS — F39 Unspecified mood [affective] disorder: Secondary | ICD-10-CM | POA: Diagnosis present

## 2019-04-09 DIAGNOSIS — I252 Old myocardial infarction: Secondary | ICD-10-CM | POA: Diagnosis not present

## 2019-04-09 DIAGNOSIS — M5126 Other intervertebral disc displacement, lumbar region: Secondary | ICD-10-CM | POA: Diagnosis present

## 2019-04-09 DIAGNOSIS — E1165 Type 2 diabetes mellitus with hyperglycemia: Secondary | ICD-10-CM | POA: Diagnosis not present

## 2019-04-09 DIAGNOSIS — Z1159 Encounter for screening for other viral diseases: Secondary | ICD-10-CM | POA: Diagnosis not present

## 2019-04-09 DIAGNOSIS — Z79899 Other long term (current) drug therapy: Secondary | ICD-10-CM

## 2019-04-09 DIAGNOSIS — N529 Male erectile dysfunction, unspecified: Secondary | ICD-10-CM | POA: Diagnosis present

## 2019-04-09 DIAGNOSIS — K219 Gastro-esophageal reflux disease without esophagitis: Secondary | ICD-10-CM | POA: Diagnosis present

## 2019-04-09 DIAGNOSIS — Z9114 Patient's other noncompliance with medication regimen: Secondary | ICD-10-CM

## 2019-04-09 DIAGNOSIS — E782 Mixed hyperlipidemia: Secondary | ICD-10-CM | POA: Diagnosis not present

## 2019-04-09 HISTORY — DX: Anxiety disorder, unspecified: F41.9

## 2019-04-09 HISTORY — PX: LEFT HEART CATH AND CORONARY ANGIOGRAPHY: CATH118249

## 2019-04-09 HISTORY — DX: Atherosclerotic heart disease of native coronary artery without angina pectoris: I25.10

## 2019-04-09 LAB — CBC
HCT: 46.8 % (ref 39.0–52.0)
Hemoglobin: 17.1 g/dL — ABNORMAL HIGH (ref 13.0–17.0)
MCH: 32.7 pg (ref 26.0–34.0)
MCHC: 36.5 g/dL — ABNORMAL HIGH (ref 30.0–36.0)
MCV: 89.5 fL (ref 80.0–100.0)
Platelets: 204 10*3/uL (ref 150–400)
RBC: 5.23 MIL/uL (ref 4.22–5.81)
RDW: 11.7 % (ref 11.5–15.5)
WBC: 6 10*3/uL (ref 4.0–10.5)
nRBC: 0 % (ref 0.0–0.2)

## 2019-04-09 LAB — LIPID PANEL
Cholesterol: 305 mg/dL — ABNORMAL HIGH (ref 0–200)
HDL: 34 mg/dL — ABNORMAL LOW (ref >40)
LDL Cholesterol: UNDETERMINED mg/dL (ref 0–99)
Total CHOL/HDL Ratio: 9 RATIO
Triglycerides: 781 mg/dL — ABNORMAL HIGH (ref <150)
VLDL: UNDETERMINED mg/dL (ref 0–40)

## 2019-04-09 LAB — HEMOGLOBIN A1C
Hgb A1c MFr Bld: 11.4 % — ABNORMAL HIGH (ref 4.8–5.6)
Hgb A1c MFr Bld: 11.7 % — ABNORMAL HIGH (ref 4.8–5.6)
Mean Plasma Glucose: 280.48 mg/dL
Mean Plasma Glucose: 289.09 mg/dL

## 2019-04-09 LAB — URINE DRUG SCREEN, QUALITATIVE (ARMC ONLY)
Amphetamines, Ur Screen: NOT DETECTED
Barbiturates, Ur Screen: NOT DETECTED
Benzodiazepine, Ur Scrn: NOT DETECTED
Cannabinoid 50 Ng, Ur ~~LOC~~: NOT DETECTED
Cocaine Metabolite,Ur ~~LOC~~: NOT DETECTED
MDMA (Ecstasy)Ur Screen: NOT DETECTED
Methadone Scn, Ur: NOT DETECTED
Opiate, Ur Screen: NOT DETECTED
Phencyclidine (PCP) Ur S: NOT DETECTED
Tricyclic, Ur Screen: NOT DETECTED

## 2019-04-09 LAB — TROPONIN I (HIGH SENSITIVITY)
Troponin I (High Sensitivity): 134 ng/L (ref <18)
Troponin I (High Sensitivity): 163 ng/L (ref <18)
Troponin I (High Sensitivity): 159 ng/L (ref <18)

## 2019-04-09 LAB — SARS CORONAVIRUS 2 BY RT PCR (HOSPITAL ORDER, PERFORMED IN ~~LOC~~ HOSPITAL LAB): SARS Coronavirus 2: NEGATIVE

## 2019-04-09 LAB — BASIC METABOLIC PANEL
Anion gap: 13 (ref 5–15)
BUN: 9 mg/dL (ref 6–20)
CO2: 22 mmol/L (ref 22–32)
Calcium: 9.4 mg/dL (ref 8.9–10.3)
Chloride: 100 mmol/L (ref 98–111)
Creatinine, Ser: 0.63 mg/dL (ref 0.61–1.24)
GFR calc Af Amer: >60 mL/min (ref >60)
GFR calc non Af Amer: >60 mL/min (ref >60)
Glucose, Bld: 409 mg/dL — ABNORMAL HIGH (ref 70–99)
Potassium: 4 mmol/L (ref 3.5–5.1)
Sodium: 135 mmol/L (ref 135–145)

## 2019-04-09 LAB — GLUCOSE, CAPILLARY
Glucose-Capillary: 278 mg/dL — ABNORMAL HIGH (ref 70–99)
Glucose-Capillary: 218 mg/dL — ABNORMAL HIGH (ref 70–99)
Glucose-Capillary: 198 mg/dL — ABNORMAL HIGH (ref 70–99)
Glucose-Capillary: 383 mg/dL — ABNORMAL HIGH (ref 70–99)

## 2019-04-09 LAB — TSH: TSH: 1.446 u[IU]/mL (ref 0.350–4.500)

## 2019-04-09 LAB — LDL CHOLESTEROL, DIRECT: Direct LDL: 140.2 mg/dL — ABNORMAL HIGH (ref 0–99)

## 2019-04-09 SURGERY — LEFT HEART CATH AND CORONARY ANGIOGRAPHY
Anesthesia: Moderate Sedation

## 2019-04-09 MED ORDER — METOPROLOL TARTRATE 25 MG PO TABS: 25.0000 mg | ORAL_TABLET | Freq: Two times a day (BID) | ORAL | Status: DC

## 2019-04-09 MED ORDER — ASPIRIN 300 MG RE SUPP: 300.0000 mg | RECTAL | Status: DC

## 2019-04-09 MED ORDER — SODIUM CHLORIDE 0.9% FLUSH
3.0000 mL | Freq: Two times a day (BID) | INTRAVENOUS | Status: DC
  Administered 2019-04-09: 3 mL via INTRAVENOUS

## 2019-04-09 MED ORDER — HEPARIN (PORCINE) IN NACL 1000-0.9 UT/500ML-% IV SOLN
INTRAVENOUS | Status: AC
  Filled 2019-04-09: qty 1000

## 2019-04-09 MED ORDER — SODIUM CHLORIDE 0.9 % IV SOLN: 250.0000 mL | INTRAVENOUS | Status: DC | PRN

## 2019-04-09 MED ORDER — HEPARIN SODIUM (PORCINE) 1000 UNIT/ML IJ SOLN
INTRAMUSCULAR | Status: DC | PRN
  Administered 2019-04-09: 4000 [IU] via INTRAVENOUS

## 2019-04-09 MED ORDER — METOPROLOL TARTRATE 25 MG PO TABS
12.5000 mg | ORAL_TABLET | Freq: Two times a day (BID) | ORAL | Status: DC
  Filled 2019-04-09: qty 1

## 2019-04-09 MED ORDER — HEPARIN SODIUM (PORCINE) 1000 UNIT/ML IJ SOLN
INTRAMUSCULAR | Status: AC
  Filled 2019-04-09: qty 1

## 2019-04-09 MED ORDER — IOHEXOL 300 MG/ML  SOLN
INTRAMUSCULAR | Status: DC | PRN
  Administered 2019-04-09: 50 mL via INTRA_ARTERIAL

## 2019-04-09 MED ORDER — INSULIN ASPART 100 UNIT/ML ~~LOC~~ SOLN
0.0000 [IU] | Freq: Three times a day (TID) | SUBCUTANEOUS | Status: DC
  Administered 2019-04-10: 7 [IU] via SUBCUTANEOUS
  Administered 2019-04-10: 5 [IU] via SUBCUTANEOUS
  Administered 2019-04-10: 9 [IU] via SUBCUTANEOUS
  Administered 2019-04-11: 3 [IU] via SUBCUTANEOUS
  Administered 2019-04-11: 7 [IU] via SUBCUTANEOUS
  Filled 2019-04-09 (×5): qty 1

## 2019-04-09 MED ORDER — ONDANSETRON HCL 4 MG/2ML IJ SOLN: 4.0000 mg | Freq: Four times a day (QID) | INTRAMUSCULAR | Status: DC | PRN

## 2019-04-09 MED ORDER — MIDAZOLAM HCL 2 MG/2ML IJ SOLN
INTRAMUSCULAR | Status: DC | PRN
  Administered 2019-04-09: 1 mg via INTRAVENOUS

## 2019-04-09 MED ORDER — INSULIN ASPART 100 UNIT/ML ~~LOC~~ SOLN
5.0000 [IU] | Freq: Once | SUBCUTANEOUS | Status: AC
  Administered 2019-04-09: 11:00:00 5 [IU] via SUBCUTANEOUS
  Filled 2019-04-09: qty 1

## 2019-04-09 MED ORDER — HEPARIN BOLUS VIA INFUSION
4000.0000 [IU] | Freq: Once | INTRAVENOUS | Status: AC
  Administered 2019-04-09: 4000 [IU] via INTRAVENOUS
  Filled 2019-04-09: qty 4000

## 2019-04-09 MED ORDER — SODIUM CHLORIDE 0.9% FLUSH
3.0000 mL | Freq: Two times a day (BID) | INTRAVENOUS | Status: DC
  Administered 2019-04-09 – 2019-04-10 (×3): 3 mL via INTRAVENOUS

## 2019-04-09 MED ORDER — ASPIRIN EC 81 MG PO TBEC
81.0000 mg | DELAYED_RELEASE_TABLET | Freq: Every day | ORAL | Status: DC
  Administered 2019-04-10 – 2019-04-11 (×2): 81 mg via ORAL
  Filled 2019-04-09 (×2): qty 1

## 2019-04-09 MED ORDER — SODIUM CHLORIDE 0.9% FLUSH: 3.0000 mL | INTRAVENOUS | Status: DC | PRN

## 2019-04-09 MED ORDER — CLOPIDOGREL BISULFATE 75 MG PO TABS
75.0000 mg | ORAL_TABLET | Freq: Every day | ORAL | Status: DC
  Administered 2019-04-10 – 2019-04-11 (×2): 75 mg via ORAL
  Filled 2019-04-09 (×2): qty 1

## 2019-04-09 MED ORDER — SODIUM CHLORIDE 0.9 % IV SOLN
Freq: Once | INTRAVENOUS | Status: AC
  Administered 2019-04-09: 10:00:00 via INTRAVENOUS

## 2019-04-09 MED ORDER — MIDAZOLAM HCL 2 MG/2ML IJ SOLN
INTRAMUSCULAR | Status: AC
  Filled 2019-04-09: qty 2

## 2019-04-09 MED ORDER — LABETALOL HCL 5 MG/ML IV SOLN: 10.0000 mg | INTRAVENOUS | Status: AC | PRN

## 2019-04-09 MED ORDER — FENTANYL CITRATE (PF) 100 MCG/2ML IJ SOLN
INTRAMUSCULAR | Status: AC
  Filled 2019-04-09: qty 2

## 2019-04-09 MED ORDER — ALPRAZOLAM 0.25 MG PO TABS: 0.2500 mg | ORAL_TABLET | Freq: Two times a day (BID) | ORAL | Status: DC | PRN

## 2019-04-09 MED ORDER — ACETAMINOPHEN 325 MG PO TABS
650.0000 mg | ORAL_TABLET | ORAL | Status: DC | PRN
  Administered 2019-04-10: 650 mg via ORAL
  Filled 2019-04-09: qty 2

## 2019-04-09 MED ORDER — VERAPAMIL HCL 2.5 MG/ML IV SOLN
INTRAVENOUS | Status: DC | PRN
  Administered 2019-04-09: 2.5 mg via INTRA_ARTERIAL

## 2019-04-09 MED ORDER — HYDRALAZINE HCL 20 MG/ML IJ SOLN: 10.0000 mg | INTRAMUSCULAR | Status: AC | PRN

## 2019-04-09 MED ORDER — ENOXAPARIN SODIUM 40 MG/0.4ML ~~LOC~~ SOLN
40.0000 mg | SUBCUTANEOUS | Status: DC
  Filled 2019-04-09 (×3): qty 0.4

## 2019-04-09 MED ORDER — NITROGLYCERIN 0.4 MG SL SUBL: 0.4000 mg | SUBLINGUAL_TABLET | SUBLINGUAL | Status: DC | PRN

## 2019-04-09 MED ORDER — ASPIRIN 81 MG PO CHEW
324.0000 mg | CHEWABLE_TABLET | Freq: Once | ORAL | Status: AC
  Administered 2019-04-09: 324 mg via ORAL
  Filled 2019-04-09: qty 4

## 2019-04-09 MED ORDER — SODIUM CHLORIDE 0.9 % IV SOLN
INTRAVENOUS | Status: DC
  Administered 2019-04-09: 15:00:00 via INTRAVENOUS

## 2019-04-09 MED ORDER — INSULIN ASPART 100 UNIT/ML ~~LOC~~ SOLN
SUBCUTANEOUS | Status: AC
  Administered 2019-04-09: 5 [IU] via SUBCUTANEOUS
  Filled 2019-04-09: qty 1

## 2019-04-09 MED ORDER — ASPIRIN 81 MG PO CHEW
324.0000 mg | CHEWABLE_TABLET | ORAL | Status: DC
  Filled 2019-04-09: qty 4

## 2019-04-09 MED ORDER — CLOPIDOGREL BISULFATE 75 MG PO TABS
ORAL_TABLET | ORAL | Status: AC
  Administered 2019-04-09: 17:00:00 300 mg via ORAL
  Filled 2019-04-09: qty 4

## 2019-04-09 MED ORDER — HEPARIN (PORCINE) 25000 UT/250ML-% IV SOLN
1000.0000 [IU]/h | INTRAVENOUS | Status: DC
  Administered 2019-04-09: 1000 [IU]/h via INTRAVENOUS
  Filled 2019-04-09: qty 250

## 2019-04-09 MED ORDER — VERAPAMIL HCL 2.5 MG/ML IV SOLN
INTRAVENOUS | Status: AC
  Filled 2019-04-09: qty 2

## 2019-04-09 MED ORDER — SODIUM CHLORIDE 0.9 % IV SOLN: INTRAVENOUS | Status: AC

## 2019-04-09 MED ORDER — ISOSORBIDE MONONITRATE ER 30 MG PO TB24
30.0000 mg | ORAL_TABLET | Freq: Every day | ORAL | Status: DC
  Administered 2019-04-09 – 2019-04-11 (×3): 30 mg via ORAL
  Filled 2019-04-09 (×3): qty 1

## 2019-04-09 MED ORDER — CLOPIDOGREL BISULFATE 75 MG PO TABS
300.0000 mg | ORAL_TABLET | Freq: Once | ORAL | Status: AC
  Administered 2019-04-09: 17:00:00 300 mg via ORAL

## 2019-04-09 MED ORDER — METOPROLOL TARTRATE 25 MG PO TABS
25.0000 mg | ORAL_TABLET | Freq: Two times a day (BID) | ORAL | Status: DC
  Administered 2019-04-09 – 2019-04-11 (×4): 25 mg via ORAL
  Filled 2019-04-09 (×3): qty 1

## 2019-04-09 MED ORDER — ROSUVASTATIN CALCIUM 5 MG PO TABS
5.0000 mg | ORAL_TABLET | Freq: Every day | ORAL | Status: DC
  Administered 2019-04-09 – 2019-04-10 (×2): 5 mg via ORAL
  Filled 2019-04-09 (×2): qty 1

## 2019-04-09 MED ORDER — FENTANYL CITRATE (PF) 100 MCG/2ML IJ SOLN
INTRAMUSCULAR | Status: DC | PRN
  Administered 2019-04-09: 50 ug via INTRAVENOUS

## 2019-04-09 MED ORDER — SIMVASTATIN 40 MG PO TABS
40.0000 mg | ORAL_TABLET | Freq: Every day | ORAL | Status: DC
  Filled 2019-04-09: qty 1

## 2019-04-09 MED ORDER — INSULIN ASPART 100 UNIT/ML ~~LOC~~ SOLN
0.0000 [IU] | Freq: Every day | SUBCUTANEOUS | Status: DC
  Administered 2019-04-09: 5 [IU] via SUBCUTANEOUS
  Administered 2019-04-10: 4 [IU] via SUBCUTANEOUS
  Filled 2019-04-09 (×2): qty 1

## 2019-04-09 MED ORDER — NITROGLYCERIN 2 % TD OINT
1.0000 [in_us] | TOPICAL_OINTMENT | Freq: Once | TRANSDERMAL | Status: AC
  Administered 2019-04-09: 1 [in_us] via TOPICAL
  Filled 2019-04-09: qty 1

## 2019-04-09 SURGICAL SUPPLY — 7 items
CATH INFINITI 5 FR MPA2 (CATHETERS) ×2 IMPLANT
CATH INFINITI JR4 5F (CATHETERS) ×2 IMPLANT
DEVICE RAD TR BAND REGULAR (VASCULAR PRODUCTS) ×2 IMPLANT
GLIDESHEATH SLEND SS 6F .021 (SHEATH) ×2 IMPLANT
KIT MANI 3VAL PERCEP (MISCELLANEOUS) ×3 IMPLANT
PACK CARDIAC CATH (CUSTOM PROCEDURE TRAY) ×3 IMPLANT
WIRE ROSEN-J .035X260CM (WIRE) ×2 IMPLANT

## 2019-04-09 NOTE — Consult Note (Signed)
Cardiology Consultation:   Patient ID: Lattie Cornsimothy W Ament MRN: [id number]; DOB: August 06, 1966  Admit date: 04/09/2019 Date of Consult: 04/09/2019  [place] Care Provider: [place] [place] Primary Cardiologist:[facility], Dr. Okey DupreEnd Primary Electrophysiologist:  None    Patient Profile:   Lattie Cornsimothy W Diggs is a 53 y.o. male with a hx of CAD s/p 2017 DES to pLAD, HFrEF (EF 45%), hypertension, hyperlipidemia, DM2, alcohol abuse (2015), mood disorder GERD, herniated disc/ L3 (2015), and erectile dysfunction who is being seen today for the evaluation of NSTEMI at the request of Dr. Allena KatzPatel.  History of Present Illness:   Mr. Lesly Rubensteinarrott is a 53yo male with PMH as above.  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  He denies smoking with history of alcohol abuse.  Family history is significant for CAD in his mother and father, as well as HTN in his brother and stroke in grandfather.   [facility] with 2018 admission for NSTEMI.  On 05/13/2017, he presented to the emergency department with non-pleuritic but radiating chest pain.  He reported that, over the last 2 weeks, he was experiencing worsening exertional chest pain that he described as substernal chest aching, radiating to his right chest and neck, as well as bilateral arms. The pain was initially associated with exertion while mowing the lawn but worsened to also occur at rest.  CP was nontender to palpation. It was associated with nausea, dizziness, fatigue, vomiting, diaphoresis, and dyspnea.  He denied syncope, headache, or palpitations.  In the ED, EKG showed sinus rhythm without acute changes, though mild ST depressions were noted in the precordial leads.  Troponin was minimally elevated and peaked at 0.07.  He was admitted with plans for stress test, though he continued to have exertional chest pain with 6 beat and 4 beat episodes of symptomatic NSVT.  He thus underwent cardiac catheterization which showed 95%  proximal LAD disease with minor diffuse disease. He was also noted to have minor diffuse disease. Successful PCI was performed to proximal LAD using 3.5 mm X 23 mm Xience Sierra (DES) and resulting in 95% TIMI II to normal (0%) TIMI-3 flow.  Recommendation at that time was for aggressive risk factor modification intake of granular for at least 6 months with ASA 81 mg daily indefinitely. Post procedure echocardiogram showed LVEF 45% and mild mild LVH, normal RVS P, trivial MR, trivial TR. He was also noted to have anterior/lateral/and apical wall hypocontractile.  Recommendations were for aggressive risk factor modification with noted uncontrolled diabetes and lipid panel showing LDL 137.  He was discharged on ticagrelor, lisinopril, ASA, rosuvastatin (though he was noted to be previously intolerant to his atorvastatin due to myalgias), and metoprolol.  PRN sublingual nitroglycerin was prescribed.  At discharge, he was chest pain-free and ambulating without complaint.  He was referred for cardiac rehab.  Since that time, the patient has discontinued his medications, as he became frustrated with frequently changing PCPs and cardiologists.  Of note, did note taking his ticagrelor for at least 1 year before stopping it.   Per review of EMR, he had an office visit with his PCP 06/25/2017 with c/o orthostatic hypotension and his lisinopril dose was decreased. At that time, he stated his cardiologist as Dr. Welton FlakesKhan and had follow-up scheduled with him, which is not available on review of EMR. He has experienced intermittent chest pain at rest and with episodes that have no clear triggers.  He denied any chest pain with exertion,  such as mowing his lawn. No SOB reported with exertion; however, most recent ED visits included 05/2018 ED visit for palpitations and periodic SOB / chest discomfort. 11/2018 admission for viral illness noted on review of EMR.   On 04/09/2019, he presented to [facility] ED with complaint of chest pain at  rest.  He reported this chest pain was different from his 2018 chest pain.  It initially had woken him from sleep around 2 AM and was not as severe in intensity as his 2018 chest pain.  It began as a localized central chest pain but then spread the length of his sternum "almost like in a C pattern."  No associated symptoms, other than a headache. The chest pain was not pleuritic and did not radiate down his arms or into his neck. Initial Hs troponin 134. Other labs included mean plasma glucose 280, LDL unable to calculate d/t triglycerides elevated to 781 and over 400.   Heart Pathway Score:     Past Medical History:  Diagnosis Date  . Anxiety   . CAD (coronary artery disease)    stent to lad 2018  . Diabetes mellitus without complication (HCC)   . ED (erectile dysfunction)   . GERD (gastroesophageal reflux disease)   . High cholesterol   . Hypertension   . Lumbar herniated disc     Past Surgical History:  Procedure Laterality Date  . SHOULDER SURGERY Left 2001   tissue removed from "hood" of shoulder     Home Medications:  Prior to Admission medications   Medication Sig Start Date End Date Taking? Authorizing Provider  citalopram (CELEXA) 10 MG tablet Take 10 mg by mouth daily.   Yes [provider]  Insulin Glargine (BASAGLAR KWIKPEN) 100 UNIT/ML SOPN Inject 20 Units into the skin daily as needed (elevated glucose).   Yes [provider]  lisinopril (PRINIVIL,ZESTRIL) 10 MG tablet Take 10 mg by mouth daily.   Yes [provider]  metFORMIN (GLUCOPHAGE) 500 MG tablet Take 1,000 mg by mouth 2 (two) times daily with a meal.   Yes [provider]    Inpatient Medications: Scheduled Meds: . heparin  4,000 Units Intravenous Once  . insulin aspart  0-5 Units Subcutaneous QHS  . insulin aspart  0-9 Units Subcutaneous TID WC   Continuous Infusions: . heparin 1,000 Units/hr (04/09/19 1113)   PRN Meds:   Allergies:    Allergies  Allergen  Reactions  . Statins Other (See Comments)    Joint and muscle pain    Social History:   Social History   Socioeconomic History  . Marital status: Married    Spouse name: Not on file  . Number of children: Not on file  . Years of education: Not on file  . Highest education level: Not on file  Occupational History  . Not on file  Social Needs  . Financial resource strain: Not on file  . Food insecurity    Worry: Not on file    Inability: Not on file  . Transportation needs    Medical: Not on file    Non-medical: Not on file  Tobacco Use  . Smoking status: Never Smoker  . Smokeless tobacco: Never Used  Substance and Sexual Activity  . Alcohol use: Yes  . Drug use: No  . Sexual activity: Not on file  Lifestyle  . Physical activity    Days per week: Not on file    Minutes per session: Not on file  . Stress:  Not on file  Relationships  . Social Herbalist on phone: Not on file    Gets together: Not on file    Attends religious service: Not on file    Active member of club or organization: Not on file    Attends meetings of clubs or organizations: Not on file    Relationship status: Not on file  . Intimate partner violence    Fear of current or ex partner: Not on file    Emotionally abused: Not on file    Physically abused: Not on file    Forced sexual activity: Not on file  Other Topics Concern  . Not on file  Social History Narrative  . Not on file    Family History:    Family History  Problem Relation Age of Onset  . CAD Mother   . CAD Father      ROS:  Please see the history of present illness.  Review of Systems  Respiratory: Positive for cough. Negative for shortness of breath.        Previous. No SOB except with cough  Cardiovascular: Positive for chest pain. Negative for palpitations, orthopnea and leg swelling.  All other systems reviewed and are negative.   All other ROS reviewed and negative.     Physical Exam/Data:   Vitals:    04/09/19 0931 04/09/19 0933 04/09/19 0937 04/09/19 1000  BP: (!) 167/112   (!) 134/96  Pulse: 100   95  Resp:   17   Temp:   98.2 F (36.8 C)   TempSrc:   Oral   SpO2: 97%   96%  Weight:  80.7 kg    Height:  5\' 9"  (1.753 m)     No intake or output data in the 24 hours ending 04/09/19 1114 Last 3 Weights 04/09/2019 03/13/2017 08/28/2016  Weight (lbs) 178 lb 183 lb 178 lb  Weight (kg) 80.74 kg 83.008 kg 80.74 kg     Body mass index is 26.29 kg/m.  General:  Well nourished, well developed, in no acute distress HEENT: normal Neck: no JVD Vascular: radial pulses 2+ bilaterally Cardiac:  normal S1, S2; RRR; no murmur  Lungs:  clear to auscultation bilaterally, no wheezing, rhonchi or rales  Abd: soft, nontender, no hepatomegaly  Ext: no edema Musculoskeletal:  No deformities, BUE and BLE strength normal and equal Skin: warm and dry  Neuro:  No focal abnormalities noted Psych:  Normal affect   EKG:  The EKG was personally reviewed and demonstrates:   Sinus tachycardia, 106 bpm, IVCD with QRS 89 ms QTc 468, left axis deviation, inferior leads with poor conduction, nonspecific ST/T changes Telemetry:  Telemetry was personally reviewed and demonstrates:  SR-ST, PVCs  Relevant CV Studies:  Pending echo Pending cath 8/17: LHC Diagnostic Findings Coronary arteries Dominance: co-dominant Left main: normal LAD: prox 95% - TIMI 2 LCx: OM2 60% - medium; OM3 50% - large RCA: insignificant Impressions 95% "widow-maker" prox LAD disease Minor diffuse disease elsewhere Successful PCI Lesion: 95% prox LAD Devices: 2.11mm balloon (pre-dil), 3.70mm x37mm Xience Sierra (drug-eluting) stent Result: 95% TIMI2 to normal (0%) TIMI 3 flow   8/17: TTE:  LEFT VENTRICLE                   Anterior: HYPOCONTRACTILE    Size: SMALL                 Lateral: HYPOCONTRACTILE Contraction: REGIONALLY IMPAIRED  Septal: Normal Closest EF: 45%  (Estimated) Calc.EF: 43% (3D)   Apical: HYPOCONTRACTILE  LV masses: No Masses               Inferior: Normal     LVH: MILD LVH CONCENTRIC         Posterior: Normal Dias.FxClass: RELAXATION ABNORMALITY (GRADE 1) CORRESPONDS TO E/A REVERSAL  INTERPRETATION  MILD LV DYSFUNCTION (See above) WITH MILD LVH NORMAL RIGHT VENTRICULAR SYSTOLIC FUNCTION VALVULAR REGURGITATION: TRIVIAL MR, TRIVIAL TR NO VALVULAR STENOSIS NO PRIOR STUDY FOR COMPARISON    Laboratory Data:  High Sensitivity Troponin:   Recent Labs  Lab 04/09/19 0946  TROPONINIHS 134*     Cardiac EnzymesNo results for input(s): TROPONINI in the last 168 hours. No results for input(s): TROPIPOC in the last 168 hours.  Chemistry Recent Labs  Lab 04/09/19 0946  NA 135  K 4.0  CL 100  CO2 22  GLUCOSE 409*  BUN 9  CREATININE 0.63  CALCIUM 9.4  GFRNONAA >60  GFRAA >60  ANIONGAP 13    No results for input(s): PROT, ALBUMIN, AST, ALT, ALKPHOS, BILITOT in the last 168 hours. Hematology Recent Labs  Lab 04/09/19 0946  WBC 6.0  RBC 5.23  HGB 17.1*  HCT 46.8  MCV 89.5  MCH 32.7  MCHC 36.5*  RDW 11.7  PLT 204   BNPNo results for input(s): BNP, PROBNP in the last 168 hours.  DDimer No results for input(s): DDIMER in the last 168 hours.   Radiology/Studies:  Dg Chest Port 1 View  Result Date: 04/09/2019 CLINICAL DATA:  Chest pain EXAM: PORTABLE CHEST 1 VIEW COMPARISON:  12/20/2015 FINDINGS: Normal heart size and mediastinal contours. No acute infiltrate or edema. No effusion or pneumothorax. No acute osseous findings. IMPRESSION: Negative chest. Electronically Signed   By: Marnee SpringJonathon  Watts M.D.   On: 04/09/2019 09:44    Assessment and Plan:   NSTEMI --Mild but current CP. Stated CP different from previous NSTEMI in 2018, which resulted in LHC with DES to pLAD and noted diffuse mild disease as in HPI. --Initial HS Tn elevated to 134 and cycling. Continue to cycle until  peaked and down-trending. EKG sinus tachycardia without acute ST/T changes. Tachycardic and hypertensive at presentation. --Risk factors for cardiac etiology include uncontrolled / untreated DM2, HTN, HLD, alcohol use as well as h/o NSTEMI with DES to pLAD and diffuse but mild vessel disease noted in 2018. Patient also has not taken any cardiac medications for at least 1 year and has not ensured risk factor modification. Given risk factors, further ischemic workup with cardiac cath recommended. --Started on heparin and NPO order placed and should continue in preparation for cath. Renal function stable with Cr 0.63, Hgb 17.1 --LHC scheduled for today 7/14 for further ischemic workup.  --Further recommendations pending LHC and echo and likely include ASA, BB, statin, SL nitro, and ACE/ARB given HTN and DM2 with slightly reduced EF.  CAD s/p pLAD DES - Current CP --Known diffuse mild disease with 2018 DES to pLAD as above --Patient has not taken recommended cardiac medications s/p catheterization and was lost to follow-up with poor control of risk factors --[facility] with further medical therapy recommendations to likely include ASA, BB, statin, SL nitro, and ACE/ARB given HTN /DM2 with slightly reduced EF. Started on BB in ED.   HFrEF --Not volume overloaded on exam. No current SOB. --Not taking previous cardiac medications --Previous echo as above. Pending updated echo / cath --Started on low dose  BB to be escalated as tolerated in the ED.  --Plan to restart ACE/ARB therapy before discharge.  Uncontrolled DM2 --Not taking medications --Ordered A1C  --Recommend restart of medications at discharge --SSI, per IM  HLD --Updated lipid panel ordered with LDL unable to be assessed d/t elevated triglycerides  --2018 LDL 137 --Statin therapy recommended after obtaining baseline liver function and with f/u lipid and liver in 6-8 weeks  Hypertriglyceridemia --Per IM  HTN, uncontrolled  --Started on BB with recommendation for ACE/ARB before discharge    For questions or updates, please contact [facility] Please consult [email or web address] for contact info under     Signed, Lennon AlstromJacquelyn D Vanessa Kampf, PA-C  04/09/2019 11:14 AM

## 2019-04-09 NOTE — ED Notes (Signed)
Admitting Dr at bedside

## 2019-04-09 NOTE — ED Notes (Signed)
Per pt request- spoke with wife and gave her an update on pt's plan of care

## 2019-04-09 NOTE — ED Provider Notes (Addendum)
[facility] Emergency Department Provider Note       Time seen: ----------------------------------------- 9:34 AM on 04/09/2019 -----------------------------------------   I have reviewed the triage vital signs and the nursing notes.  HISTORY   Chief Complaint Chest Pain   HPI Matthew Blevins is a 53 y.o. male with a history of diabetes, GERD, hyperlipidemia, hypertension who presents to the ED for chest pain.  Patient [facility] by EMS [facility].  He had chest pain that started last night.  Currently having pain in the middle of his chest.  Initially had shortness of breath but this is resolved.  Denies sweats or nausea.  Pain is 4 out of 10.  Past Medical History:  Diagnosis Date  . Diabetes mellitus without complication (HCC)   . ED (erectile dysfunction)   . GERD (gastroesophageal reflux disease)   . High cholesterol   . Hypertension   . Lumbar herniated disc     There are no active problems to display for this patient.   Past Surgical History:  Procedure Laterality Date  . SHOULDER SURGERY Left 2001   tissue removed from "hood" of shoulder    Allergies Patient has no known allergies.  Social History Social History   Tobacco Use  . Smoking status: Never Smoker  . Smokeless tobacco: Never Used  Substance Use Topics  . Alcohol use: Yes  . Drug use: No    Review of Systems Constitutional: Negative for fever. Cardiovascular: Positive for chest pain Respiratory: Negative for shortness of breath. Gastrointestinal: Negative for abdominal pain, vomiting and diarrhea. Musculoskeletal: Negative for back pain. Skin: Negative for rash. Neurological: Negative for headaches, focal weakness or numbness.  All systems negative/normal/unremarkable except as stated in the HPI  ____________________________________________   PHYSICAL EXAM:  VITAL SIGNS: ED Triage Vitals  Enc Vitals Group     BP 04/09/19 0931 (!) 167/112      Pulse Rate 04/09/19 0931 100     Resp --      Temp --      Temp src --      SpO2 04/09/19 0931 97 %     Weight 04/09/19 0933 178 lb (80.7 kg)     Height 04/09/19 0933 5\' 9"  (1.753 m)     Head Circumference --      Peak Flow --      Pain Score 04/09/19 0932 4     Pain Loc --      Pain Edu? --      Excl. in GC? --     Constitutional: Alert and oriented. Well appearing and in no distress. Eyes: Conjunctivae are normal. Normal extraocular movements. ENT      Head: Normocephalic and atraumatic.      Nose: No congestion/rhinnorhea.      Mouth/Throat: Mucous membranes are moist.      Neck: No stridor. Cardiovascular: Normal rate, regular rhythm. No murmurs, rubs, or gallops. Respiratory: Normal respiratory effort without tachypnea nor retractions. Breath sounds are clear and equal bilaterally. No wheezes/rales/rhonchi. Gastrointestinal: Soft and nontender. Normal bowel sounds Musculoskeletal: Nontender with normal range of motion in extremities. No lower extremity tenderness nor edema. Neurologic:  Normal speech and language. No gross focal neurologic deficits are appreciated.  Skin:  Skin is warm, dry and intact. No rash noted. Psychiatric: Mood and affect are normal. Speech and behavior are normal.  ____________________________________________  EKG: Interpreted by me.  Sinus tachycardia with a rate of 106 bpm, normal PR interval, normal QRS, normal  QT, normal axis  Sinus rhythm with rate of 96 bpm, normal PR interval, normal QRS, normal QT ____________________________________________  ED COURSE:  As part of my medical decision making, I reviewed the following data within the [place] History obtained from family if available, nursing notes, old chart and ekg, as well as notes from prior ED visits. Patient presented for chest pain, we will assess with labs and imaging as indicated at this time.   Procedures  Matthew Blevins was evaluated in Emergency  Department on 04/09/2019 for the symptoms described in the history of present illness. He was evaluated in the context of the global COVID-19 pandemic, which necessitated consideration that the patient might be at risk for infection with the SARS-CoV-2 virus that causes COVID-19. Institutional protocols and algorithms that pertain to the evaluation of patients at risk for COVID-19 are in a state of rapid change based on information released by regulatory bodies including the CDC and federal and state organizations. These policies and algorithms were followed during the patient's care in the ED.  ____________________________________________   LABS (pertinent positives/negatives)  Labs Reviewed  BASIC METABOLIC PANEL - Abnormal; Notable for the following components:      Result Value   Glucose, Bld 409 (*)    All other components within normal limits  CBC - Abnormal; Notable for the following components:   Hemoglobin 17.1 (*)    MCHC 36.5 (*)    All other components within normal limits  TROPONIN I (HIGH SENSITIVITY) - Abnormal; Notable for the following components:   Troponin I (High Sensitivity) 134 (*)    All other components within normal limits  URINE DRUG SCREEN, QUALITATIVE ([facility] ONLY)   CRITICAL CARE Performed by: Laurence Aly   Total critical care time: 30 minutes  Critical care time was exclusive of separately billable procedures and treating other patients.  Critical care was necessary to treat or prevent imminent or life-threatening deterioration.  Critical care was time spent personally by me on the following activities: development of treatment plan with patient and/or surrogate as well as nursing, discussions with consultants, evaluation of patient's response to treatment, examination of patient, obtaining history from patient or surrogate, ordering and performing treatments and interventions, ordering and review of laboratory studies, ordering and review of  radiographic studies, pulse oximetry and re-evaluation of patient's condition.  RADIOLOGY Images were viewed by me  Chest x-ray IMPRESSION: Negative chest.  ____________________________________________   DIFFERENTIAL DIAGNOSIS   Musculoskeletal pain, GERD, anxiety, unstable angina, PE, hyperglycemia, hyperlipidemia  FINAL ASSESSMENT AND PLAN  Chest pain, NSTEMI   Plan: The patient had presented for central chest pain. Patient's labs did indicate an NSTEMI.  Troponin was 134, he was given aspirin, nitroglycerin paste and placed on heparin.  Patient's imaging not reveal any acute process.  Currently he is chest pain-free.  I will discuss with the hospitalist for admission.   Laurence Aly, MD    Note: This note was generated in part or whole with voice recognition software. Voice recognition is usually quite accurate but there are transcription errors that can and very often do occur. I apologize for any typographical errors that were not detected and corrected.     Earleen Newport, MD 04/09/19 1030    Earleen Newport, MD 04/09/19 1031

## 2019-04-09 NOTE — Interval H&P Note (Signed)
History and Physical Interval Note:  04/09/2019 3:26 PM  Matthew Blevins  has presented today for cardiac catheterization, with the diagnosis of NSTEMI.  The various methods of treatment have been discussed with the patient and family. After consideration of risks, benefits and other options for treatment, the patient has consented to  Procedure(s): LEFT HEART CATH AND CORONARY ANGIOGRAPHY (N/A) as a surgical intervention.  The patient's history has been reviewed, patient examined, no change in status, stable for surgery.  I have reviewed the patient's chart and labs.  Questions were answered to the patient's satisfaction.    Cath Lab Visit (complete for each Cath Lab visit)  Clinical Evaluation Leading to the Procedure:   ACS: Yes.    Non-ACS:  N/A  Belford Pascucci

## 2019-04-09 NOTE — H&P (Signed)
[employer] - [place] [facility]   PATIENT NAME: Matthew Blevins Newmann    MR#:  [id number]  DATE OF BIRTH:  Apr 25, 1966  DATE OF ADMISSION:  04/09/2019  [facility] [facility] [place]   REQUESTING/REFERRING PHYSICIAN: Emily FilbertWilliams, Jonathan E, MD  CHIEF COMPLAINT:   Chief Complaint  Patient presents with  . Chest Pain    HISTORY OF PRESENT ILLNESS: Matthew Blevins Minckler  is a 53 y.o. male with a known history of coronary artery disease with previous [facility].  Patient states that he was taking his medications as scheduled for year after his stent.  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  And also other medications were not making him feel good.  So he stopped taking all his medications.  He also states that for his diabetes he was just using as needed insulin.  He has not been able to get back to see his primary care providers.  Resents with complaint of sharp chest pain which she describes as stabbing type of pain last night.  He went to work today and continued to have symptoms.  He had chest pain along with that he got short of breath.  In the ER patient was noted to have elevated troponin with no EKG changes suggestive of non-ST MI.  He denies any nausea or vomiting or radiation of the pain to his neck jaw or arm.  He does complain of pain radiating up to his chest.     PAST MEDICAL HISTORY:   Past Medical History:  Diagnosis Date  . Anxiety   . CAD (coronary artery disease)    stent to lad 2018  . Diabetes mellitus without complication (HCC)   . ED (erectile dysfunction)   . GERD (gastroesophageal reflux disease)   . High cholesterol   . Hypertension   . Lumbar herniated disc     PAST SURGICAL HISTORY:  Past Surgical History:  Procedure Laterality Date  . SHOULDER SURGERY Left 2001   tissue removed from "hood" of shoulder    SOCIAL HISTORY:  Social History   Tobacco Use  .  Smoking status: Never Smoker  . Smokeless tobacco: Never Used  Substance Use Topics  . Alcohol use: Yes    FAMILY HISTORY:  Family History  Problem Relation Age of Onset  . CAD Mother   . CAD Father     DRUG ALLERGIES:  Allergies  Allergen Reactions  . Statins Other (See Comments)    Joint and muscle pain    REVIEW OF SYSTEMS:   CONSTITUTIONAL: No fever, fatigue or weakness.  EYES: No blurred or double vision.  EARS, NOSE, AND THROAT: No tinnitus or ear pain.  RESPIRATORY: No cough, shortness of breath, wheezing or hemoptysis.  CARDIOVASCULAR: Positive chest pain, orthopnea, edema.  GASTROINTESTINAL: No nausea, vomiting, diarrhea or abdominal pain.  GENITOURINARY: No dysuria, hematuria.  ENDOCRINE: No polyuria, nocturia,  HEMATOLOGY: No anemia, easy bruising or bleeding SKIN: No rash or lesion. MUSCULOSKELETAL: No joint pain or arthritis.   NEUROLOGIC: No tingling, numbness, weakness.  PSYCHIATRY: No anxiety or depression.   MEDICATIONS AT HOME:  Prior to Admission medications   Medication Sig Start Date End Date Taking? Authorizing Provider  citalopram (CELEXA) 10 MG tablet Take 10 mg by mouth daily.   Yes [provider]  Insulin Glargine (BASAGLAR KWIKPEN) 100 UNIT/ML SOPN Inject 20 Units into the skin daily as needed (elevated glucose).  Yes [provider]  lisinopril (PRINIVIL,ZESTRIL) 10 MG tablet Take 10 mg by mouth daily.   Yes [provider]  metFORMIN (GLUCOPHAGE) 500 MG tablet Take 1,000 mg by mouth 2 (two) times daily with a meal.   Yes [provider]      PHYSICAL EXAMINATION:   VITAL SIGNS: Blood pressure (!) 134/96, pulse 95, temperature 98.2 F (36.8 C), temperature source Oral, resp. rate 17, height 5\' 9"  (1.753 m), weight 80.7 kg, SpO2 96 %.  GENERAL:  53 y.o.-year-old patient lying in the bed with no acute distress.  EYES: Pupils equal, round, reactive to light and accommodation. No scleral icterus.  Extraocular muscles intact.  HEENT: Head atraumatic, normocephalic. Oropharynx and nasopharynx clear.  NECK:  Supple, no jugular venous distention. No thyroid enlargement, no tenderness.  LUNGS: Normal breath sounds bilaterally, no wheezing, rales,rhonchi or crepitation. No use of accessory muscles of respiration.  CARDIOVASCULAR: S1, S2 normal. No murmurs, rubs, or gallops.  ABDOMEN: Soft, nontender, nondistended. Bowel sounds present. No organomegaly or mass.  EXTREMITIES: No pedal edema, cyanosis, or clubbing.  NEUROLOGIC: Cranial nerves II through XII are intact. Muscle strength 5/5 in all extremities. Sensation intact. Gait not checked.  PSYCHIATRIC: The patient is alert and oriented x 3.  SKIN: No obvious rash, lesion, or ulcer.   LABORATORY PANEL:   CBC Recent Labs  Lab 04/09/19 0946  WBC 6.0  HGB 17.1*  HCT 46.8  PLT 204  MCV 89.5  MCH 32.7  MCHC 36.5*  RDW 11.7   ------------------------------------------------------------------------------------------------------------------  Chemistries  Recent Labs  Lab 04/09/19 0946  NA 135  K 4.0  CL 100  CO2 22  GLUCOSE 409*  BUN 9  CREATININE 0.63  CALCIUM 9.4   ------------------------------------------------------------------------------------------------------------------ estimated creatinine clearance is 108 mL/min (by C-G formula based on SCr of 0.63 mg/dL). ------------------------------------------------------------------------------------------------------------------ No results for input(s): TSH, T4TOTAL, T3FREE, THYROIDAB in the last 72 hours.  Invalid input(s): FREET3   Coagulation profile No results for input(s): INR, PROTIME in the last 168 hours. ------------------------------------------------------------------------------------------------------------------- No results for input(s): DDIMER in the last 72  hours. -------------------------------------------------------------------------------------------------------------------  Cardiac Enzymes No results for input(s): CKMB, TROPONINI, MYOGLOBIN in the last 168 hours.  Invalid input(s): CK ------------------------------------------------------------------------------------------------------------------ Invalid input(s): POCBNP  ---------------------------------------------------------------------------------------------------------------  Urinalysis    Component Value Date/Time   COLORURINE YELLOW 08/28/2016 1043   APPEARANCEUR CLEAR 08/28/2016 1043   LABSPEC 1.020 08/28/2016 1043   PHURINE 6.5 08/28/2016 1043   GLUCOSEU 500 (A) 08/28/2016 1043   HGBUR NEGATIVE 08/28/2016 1043   BILIRUBINUR NEGATIVE 08/28/2016 1043   KETONESUR NEGATIVE 08/28/2016 1043   PROTEINUR TRACE (A) 08/28/2016 1043   NITRITE NEGATIVE 08/28/2016 1043   LEUKOCYTESUR NEGATIVE 08/28/2016 1043     RADIOLOGY: Dg Chest Port 1 View  Result Date: 04/09/2019 CLINICAL DATA:  Chest pain EXAM: PORTABLE CHEST 1 VIEW COMPARISON:  12/20/2015 FINDINGS: Normal heart size and mediastinal contours. No acute infiltrate or edema. No effusion or pneumothorax. No acute osseous findings. IMPRESSION: Negative chest. Electronically Signed   By: Marnee SpringJonathon  Watts M.D.   On: 04/09/2019 09:44    EKG: Orders placed or performed during the hospital encounter of 04/09/19  . ED EKG  . ED EKG  . EKG 12-Lead  . EKG 12-Lead  . EKG 12-Lead  . EKG 12-Lead    IMPRESSION AND PLAN: Patient is 53 year old presenting with chest pain  1.  Non-ST MI We will place patient on telemetry Continue heparin drip For now keep him n.p.o. in case cardiology wants  to do cardiac cath later if no plan for cardiac cath can start feeding him Continue aspirin I will start him on metoprolol Patient states that he could not tolerate statins but no specific allergy.  We will treat with Lipitor  2.   Diabetes type 2 with blood sugars in the 400s Suspect this is due to patient not taking his medications I will check a hemoglobin A1c We will place him on sliding scale insulin Give him low-dose Lantus  3.  Hyperlipidemia check lipid panel in the morning  4.  Hypertension we will start him on metoprolol will adjust his medications as needed  5.  Heparin for DVT prophylaxis    All the records are reviewed and case discussed with ED provider. Management plans discussed with the patient, family and they are in agreement.  CODE STATUS: Full    TOTAL TIME TAKING CARE OF THIS PATIENT:92minutes.    Dustin Flock M.D on 04/09/2019 at 11:13 AM  Between 7am to 6pm - Pager - [phone]  After 6pm go to [email or web address] - Proofreader  [employer] Office  [phone]  CC: Primary care physician; [place], [facility]

## 2019-04-09 NOTE — ED Triage Notes (Signed)
Pt arrives via EMS from work at [place] for chest pain that started at 2:00 last night- still has pain in the middle of his chest- initially had [facility] but states that has subsided

## 2019-04-09 NOTE — H&P (View-Only) (Signed)
Cardiology Consultation:   Patient ID: Matthew Blevins MRN: [id number]; DOB: August 06, 1966  Admit date: 04/09/2019 Date of Consult: 04/09/2019  Primary Care Provider: [place] Primary Cardiologist:[facility], Dr. Okey DupreEnd Primary Electrophysiologist:  None    Patient Profile:   Matthew Blevins is a 53 y.o. male with a hx of CAD s/p 2017 DES to pLAD, HFrEF (EF 45%), hypertension, hyperlipidemia, DM2, alcohol abuse (2015), mood disorder GERD, herniated disc/ L3 (2015), and erectile dysfunction who is being seen today for the evaluation of NSTEMI at the request of Dr. Allena KatzPatel.  History of Present Illness:   Matthew Blevins is a 53yo male with PMH as above.  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  He denies smoking with history of alcohol abuse.  Family history is significant for CAD in his mother and father, as well as HTN in his brother and stroke in grandfather.   [facility] with [facility] admission for NSTEMI.  On 05/13/2017, he presented to the emergency department with non-pleuritic but radiating chest pain.  He reported that, over the last 2 weeks, he was experiencing worsening exertional chest pain that he described as substernal chest aching, radiating to his right chest and neck, as well as bilateral arms. The pain was initially associated with exertion while mowing the lawn but worsened to also occur at rest.  CP was nontender to palpation. It was associated with nausea, dizziness, fatigue, vomiting, diaphoresis, and dyspnea.  He denied syncope, headache, or palpitations.  In the ED, EKG showed sinus rhythm without acute changes, though mild ST depressions were noted in the precordial leads.  Troponin was minimally elevated and peaked at 0.07.  He was admitted with plans for stress test, though he continued to have exertional chest pain with 6 beat and 4 beat episodes of symptomatic NSVT.  He thus underwent cardiac catheterization which showed 95%  proximal LAD disease with minor diffuse disease. He was also noted to have minor diffuse disease. Successful PCI was performed to proximal LAD using 3.5 mm X 23 mm Xience Sierra (DES) and resulting in 95% TIMI II to normal (0%) TIMI-3 flow.  Recommendation at that time was for aggressive risk factor modification intake of granular for at least 6 months with ASA 81 mg daily indefinitely. Post procedure echocardiogram showed LVEF 45% and mild mild LVH, normal RVS P, trivial MR, trivial TR. He was also noted to have anterior/lateral/and apical wall hypocontractile.  Recommendations were for aggressive risk factor modification with noted uncontrolled diabetes and lipid panel showing LDL 137.  He was discharged on ticagrelor, lisinopril, ASA, rosuvastatin (though he was noted to be previously intolerant to his atorvastatin due to myalgias), and metoprolol.  PRN sublingual nitroglycerin was prescribed.  At discharge, he was chest pain-free and ambulating without complaint.  He was referred for cardiac rehab.  Since that time, the patient has discontinued his medications, as he became frustrated with frequently changing PCPs and cardiologists.  Of note, did note taking his ticagrelor for at least 1 year before stopping it.   Per review of EMR, he had an office visit with his PCP 06/25/2017 with c/o orthostatic hypotension and his lisinopril dose was decreased. At that time, he stated his cardiologist as Dr. Welton FlakesKhan and had follow-up scheduled with him, which is not available on review of EMR. He has experienced intermittent chest pain at rest and with episodes that have no clear triggers.  He denied any chest pain with exertion,  such as mowing his lawn. No SOB reported with exertion; however, most recent ED visits included 05/2018 ED visit for palpitations and periodic SOB / chest discomfort. 11/2018 admission for viral illness noted on review of EMR.   On 04/09/2019, he presented to [facility] ED with complaint of chest pain at  rest.  He reported this chest pain was different from his 2018 chest pain.  It initially had woken him from sleep around 2 AM and was not as severe in intensity as his 2018 chest pain.  It began as a localized central chest pain but then spread the length of his sternum "almost like in a C pattern."  No associated symptoms, other than a headache. The chest pain was not pleuritic and did not radiate down his arms or into his neck. Initial Hs troponin 134. Other labs included mean plasma glucose 280, LDL unable to calculate d/t triglycerides elevated to 781 and over 400.   Heart Pathway Score:     Past Medical History:  Diagnosis Date  . Anxiety   . CAD (coronary artery disease)    stent to lad 2018  . Diabetes mellitus without complication (HCC)   . ED (erectile dysfunction)   . GERD (gastroesophageal reflux disease)   . High cholesterol   . Hypertension   . Lumbar herniated disc     Past Surgical History:  Procedure Laterality Date  . SHOULDER SURGERY Left 2001   tissue removed from "hood" of shoulder     Home Medications:  Prior to Admission medications   Medication Sig Start Date End Date Taking? Authorizing Provider  citalopram (CELEXA) 10 MG tablet Take 10 mg by mouth daily.   Yes [provider]  Insulin Glargine (BASAGLAR KWIKPEN) 100 UNIT/ML SOPN Inject 20 Units into the skin daily as needed (elevated glucose).   Yes [provider]  lisinopril (PRINIVIL,ZESTRIL) 10 MG tablet Take 10 mg by mouth daily.   Yes [provider]  metFORMIN (GLUCOPHAGE) 500 MG tablet Take 1,000 mg by mouth 2 (two) times daily with a meal.   Yes [provider]    Inpatient Medications: Scheduled Meds: . heparin  4,000 Units Intravenous Once  . insulin aspart  0-5 Units Subcutaneous QHS  . insulin aspart  0-9 Units Subcutaneous TID WC   Continuous Infusions: . heparin 1,000 Units/hr (04/09/19 1113)   PRN Meds:   Allergies:    Allergies  Allergen  Reactions  . Statins Other (See Comments)    Joint and muscle pain    Social History:   Social History   Socioeconomic History  . Marital status: Married    Spouse name: Not on file  . Number of children: Not on file  . Years of education: Not on file  . Highest education level: Not on file  Occupational History  . Not on file  Social Needs  . Financial resource strain: Not on file  . Food insecurity    Worry: Not on file    Inability: Not on file  . Transportation needs    Medical: Not on file    Non-medical: Not on file  Tobacco Use  . Smoking status: Never Smoker  . Smokeless tobacco: Never Used  Substance and Sexual Activity  . Alcohol use: Yes  . Drug use: No  . Sexual activity: Not on file  Lifestyle  . Physical activity    Days per week: Not on file    Minutes per session: Not on file  . Stress:   Not on file  Relationships  . Social Herbalist on phone: Not on file    Gets together: Not on file    Attends religious service: Not on file    Active member of club or organization: Not on file    Attends meetings of clubs or organizations: Not on file    Relationship status: Not on file  . Intimate partner violence    Fear of current or ex partner: Not on file    Emotionally abused: Not on file    Physically abused: Not on file    Forced sexual activity: Not on file  Other Topics Concern  . Not on file  Social History Narrative  . Not on file    Family History:    Family History  Problem Relation Age of Onset  . CAD Mother   . CAD Father      ROS:  Please see the history of present illness.  Review of Systems  Respiratory: Positive for cough. Negative for shortness of breath.        Previous. No SOB except with cough  Cardiovascular: Positive for chest pain. Negative for palpitations, orthopnea and leg swelling.  All other systems reviewed and are negative.   All other ROS reviewed and negative.     Physical Exam/Data:   Vitals:    04/09/19 0931 04/09/19 0933 04/09/19 0937 04/09/19 1000  BP: (!) 167/112   (!) 134/96  Pulse: 100   95  Resp:   17   Temp:   98.2 F (36.8 C)   TempSrc:   Oral   SpO2: 97%   96%  Weight:  80.7 kg    Height:  5\' 9"  (1.753 m)     No intake or output data in the 24 hours ending 04/09/19 1114 Last 3 Weights 04/09/2019 03/13/2017 08/28/2016  Weight (lbs) 178 lb 183 lb 178 lb  Weight (kg) 80.74 kg 83.008 kg 80.74 kg     Body mass index is 26.29 kg/m.  General:  Well nourished, well developed, in no acute distress HEENT: normal Neck: no JVD Vascular: radial pulses 2+ bilaterally Cardiac:  normal S1, S2; RRR; no murmur  Lungs:  clear to auscultation bilaterally, no wheezing, rhonchi or rales  Abd: soft, nontender, no hepatomegaly  Ext: no edema Musculoskeletal:  No deformities, BUE and BLE strength normal and equal Skin: warm and dry  Neuro:  No focal abnormalities noted Psych:  Normal affect   EKG:  The EKG was personally reviewed and demonstrates:   Sinus tachycardia, 106 bpm, IVCD with QRS 89 ms QTc 468, left axis deviation, inferior leads with poor conduction, nonspecific ST/T changes Telemetry:  Telemetry was personally reviewed and demonstrates:  SR-ST, PVCs  Relevant CV Studies:  Pending echo Pending cath 8/17: LHC Diagnostic Findings Coronary arteries Dominance: co-dominant Left main: normal LAD: prox 95% - TIMI 2 LCx: OM2 60% - medium; OM3 50% - large RCA: insignificant Impressions 95% "widow-maker" prox LAD disease Minor diffuse disease elsewhere Successful PCI Lesion: 95% prox LAD Devices: 2.11mm balloon (pre-dil), 3.70mm x37mm Xience Sierra (drug-eluting) stent Result: 95% TIMI2 to normal (0%) TIMI 3 flow   8/17: TTE:  LEFT VENTRICLE                   Anterior: HYPOCONTRACTILE    Size: SMALL                 Lateral: HYPOCONTRACTILE Contraction: REGIONALLY IMPAIRED  Septal: Normal Closest EF: 45%  (Estimated) Calc.EF: 43% (3D)   Apical: HYPOCONTRACTILE  LV masses: No Masses               Inferior: Normal     LVH: MILD LVH CONCENTRIC         Posterior: Normal Dias.FxClass: RELAXATION ABNORMALITY (GRADE 1) CORRESPONDS TO E/A REVERSAL  INTERPRETATION  MILD LV DYSFUNCTION (See above) WITH MILD LVH NORMAL RIGHT VENTRICULAR SYSTOLIC FUNCTION VALVULAR REGURGITATION: TRIVIAL MR, TRIVIAL TR NO VALVULAR STENOSIS NO PRIOR STUDY FOR COMPARISON    Laboratory Data:  High Sensitivity Troponin:   Recent Labs  Lab 04/09/19 0946  TROPONINIHS 134*     Cardiac EnzymesNo results for input(s): TROPONINI in the last 168 hours. No results for input(s): TROPIPOC in the last 168 hours.  Chemistry Recent Labs  Lab 04/09/19 0946  NA 135  K 4.0  CL 100  CO2 22  GLUCOSE 409*  BUN 9  CREATININE 0.63  CALCIUM 9.4  GFRNONAA >60  GFRAA >60  ANIONGAP 13    No results for input(s): PROT, ALBUMIN, AST, ALT, ALKPHOS, BILITOT in the last 168 hours. Hematology Recent Labs  Lab 04/09/19 0946  WBC 6.0  RBC 5.23  HGB 17.1*  HCT 46.8  MCV 89.5  MCH 32.7  MCHC 36.5*  RDW 11.7  PLT 204   BNPNo results for input(s): BNP, PROBNP in the last 168 hours.  DDimer No results for input(s): DDIMER in the last 168 hours.   Radiology/Studies:  Dg Chest Port 1 View  Result Date: 04/09/2019 CLINICAL DATA:  Chest pain EXAM: PORTABLE CHEST 1 VIEW COMPARISON:  12/20/2015 FINDINGS: Normal heart size and mediastinal contours. No acute infiltrate or edema. No effusion or pneumothorax. No acute osseous findings. IMPRESSION: Negative chest. Electronically Signed   By: Marnee SpringJonathon  Watts M.D.   On: 04/09/2019 09:44    Assessment and Plan:   NSTEMI --Mild but current CP. Stated CP different from previous NSTEMI in 2018, which resulted in LHC with DES to pLAD and noted diffuse mild disease as in HPI. --Initial HS Tn elevated to 134 and cycling. Continue to cycle until  peaked and down-trending. EKG sinus tachycardia without acute ST/T changes. Tachycardic and hypertensive at presentation. --Risk factors for cardiac etiology include uncontrolled / untreated DM2, HTN, HLD, alcohol use as well as h/o NSTEMI with DES to pLAD and diffuse but mild vessel disease noted in 2018. Patient also has not taken any cardiac medications for at least 1 year and has not ensured risk factor modification. Given risk factors, further ischemic workup with cardiac cath recommended. --Started on heparin and NPO order placed and should continue in preparation for cath. Renal function stable with Cr 0.63, Hgb 17.1 --LHC scheduled for today 7/14 for further ischemic workup.  --Further recommendations pending LHC and echo and likely include ASA, BB, statin, SL nitro, and ACE/ARB given HTN and DM2 with slightly reduced EF.  CAD s/p pLAD DES - Current CP --Known diffuse mild disease with 2018 DES to pLAD as above --Patient has not taken recommended cardiac medications s/p catheterization and was lost to follow-up with poor control of risk factors --[facility] with further medical therapy recommendations to likely include ASA, BB, statin, SL nitro, and ACE/ARB given HTN /DM2 with slightly reduced EF. Started on BB in ED.   HFrEF --Not volume overloaded on exam. No current SOB. --Not taking previous cardiac medications --Previous echo as above. Pending updated echo / cath --Started on low dose  BB to be escalated as tolerated in the ED.  --Plan to restart ACE/ARB therapy before discharge.  Uncontrolled DM2 --Not taking medications --Ordered A1C  --Recommend restart of medications at discharge --SSI, per IM  HLD --Updated lipid panel ordered with LDL unable to be assessed d/t elevated triglycerides  --2018 LDL 137 --Statin therapy recommended after obtaining baseline liver function and with f/u lipid and liver in 6-8 weeks  Hypertriglyceridemia --Per IM  HTN, uncontrolled  --Started on BB with recommendation for ACE/ARB before discharge    For questions or updates, please contact [facility] Please consult [email or web address] for contact info under     Signed, Lennon AlstromJacquelyn D Jemiah Cuadra, PA-C  04/09/2019 11:14 AM

## 2019-04-09 NOTE — ED Notes (Signed)
X-ray at bedside

## 2019-04-09 NOTE — ED Notes (Signed)
Attempted to call report- will call back in 10 minutes

## 2019-04-09 NOTE — Progress Notes (Signed)
[place] for Heparin Drip  Indication: chest pain/ACS  Allergies  Allergen Reactions  . Statins Other (See Comments)    Joint and muscle pain    Patient Measurements: Height: 5\' 10"  (177.8 cm) Weight: 184 lb (83.5 kg) IBW/kg (Calculated) : 73 Heparin Dosing Weight: 80.7kg  Vital Signs: Temp: 97.9 F (36.6 C) (07/14 1436) Temp Source: Oral (07/14 1436) BP: 164/102 (07/14 1436) Pulse Rate: 101 (07/14 1436)  Labs: Recent Labs    04/09/19 0946 04/09/19 1307  HGB 17.1*  --   HCT 46.8  --   PLT 204  --   CREATININE 0.63  --   TROPONINIHS 134* 163*    Estimated Creatinine Clearance: 111.5 mL/min (by C-G formula based on SCr of 0.63 mg/dL).   Medical History: Past Medical History:  Diagnosis Date  . Anxiety   . CAD (coronary artery disease)    stent to lad 2018  . Diabetes mellitus without complication ([place])   . ED (erectile dysfunction)   . GERD (gastroesophageal reflux disease)   . High cholesterol   . Hypertension   . Lumbar herniated disc     Medications:  Scheduled:  . [MAR Hold] aspirin  324 mg Oral NOW   Or  . [MAR Hold] aspirin  300 mg Rectal NOW  . [MAR Hold] aspirin EC  81 mg Oral Daily  . [MAR Hold] insulin aspart  0-5 Units Subcutaneous QHS  . [MAR Hold] insulin aspart  0-9 Units Subcutaneous TID WC  . [MAR Hold] metoprolol tartrate  12.5 mg Oral BID  . [MAR Hold] metoprolol tartrate  25 mg Oral BID  . [MAR Hold] simvastatin  40 mg Oral q1800  . [MAR Hold] sodium chloride flush  3 mL Intravenous Q12H   Infusions:  . sodium chloride    . sodium chloride 10 mL/hr at 04/09/19 1454  . heparin Stopped (04/09/19 1454)    Assessment: Pharmacy consulted for heparin drip management for 53 yo male admitted with NSTEMI. Per history, patient does not take any oral anticoagulants.   Goal of Therapy:  Heparin level 0.3-0.7 units/ml Monitor platelets by anticoagulation protocol: Yes   Plan:  Give 4000 units  bolus x 1 Start heparin infusion at 1000 units/hr Check anti-Xa level in 6 hours and daily while on heparin Continue to monitor H&H and platelets  , L 04/09/2019,3:04 PM

## 2019-04-09 NOTE — ED Notes (Signed)
Verbal order to repeat EKG. Given to Dr. Jimmye Norman. Gave pt cup of water, ok per MD. Informed pt about visitor restrictions. Wife was out in parking lot, is going home to get pt some belongings and will bring them to hospital.

## 2019-04-09 NOTE — ED Notes (Signed)
Lab called with a critical troponin of 134- Dr Jimmye Norman notified

## 2019-04-09 NOTE — ED Notes (Addendum)
Attempted to call report- will give bedside report

## 2019-04-09 NOTE — ED Notes (Signed)
ED TO INPATIENT HANDOFF REPORT  ED Nurse Name and Phone #: Kieon Lawhorn [phone]  S Name/Age/Gender Lattie Cornsimothy W Garis 53 y.o. male Room/Bed: ED10A/ED10A  Code Status   Code Status: Not on file  Home/SNF/Other Home Patient oriented to: self, place, time and situation Is this baseline? Yes   Triage Complete: Triage complete  Chief Complaint cp  Triage Note Pt arrives via EMS from work at [employer] for chest pain that started at 2:00 last night- still has pain in the middle of his chest- [facility] but states that has [facility]   Allergies Allergies  Allergen Reactions  . Statins Other (See Comments)    Joint and muscle pain    Level of Care/Admitting Diagnosis ED Disposition    ED Disposition Condition Comment   Admit  Hospital Area: [facility] [[id number]]  Level of Care: Telemetry [5]  Covid Evaluation: Asymptomatic Screening Protocol (No Symptoms)  Diagnosis: NSTEMI (non-ST elevated myocardial infarction) [facility](HCC) [[id number]][[id number]]  Admitting Physician: Auburn BilberryPATEL, SHREYANG [[id number]]  Attending Physician: Auburn BilberryPATEL, SHREYANG [[id number]]  Estimated length of stay: past midnight tomorrow  Certification:: I certify this patient will need inpatient services for at least 2 midnights  PT Class (Do Not Modify): Inpatient [101]  PT Acc Code (Do Not Modify): Private [1]       B Medical/Surgery History Past Medical History:  Diagnosis Date  . Anxiety   . CAD (coronary artery disease)    stent to lad 2018  . Diabetes mellitus without complication (HCC)   . ED (erectile dysfunction)   . GERD (gastroesophageal reflux disease)   . High cholesterol   . Hypertension   . Lumbar herniated disc    Past Surgical History:  Procedure Laterality Date  . SHOULDER SURGERY Left 2001   tissue removed from "hood" of shoulder     A IV Location/Drains/Wounds Patient Lines/Drains/Airways Status   Active Line/Drains/Airways    Name:   Placement date:   Placement time:   Site:    Days:   Peripheral IV 04/09/19 Right Antecubital   04/09/19    0944    Antecubital   less than 1   Peripheral IV 04/09/19 Left Antecubital   04/09/19    1120    Antecubital   less than 1          Intake/Output Last 24 hours No intake or output data in the 24 hours ending 04/09/19 1202  Labs/Imaging Results for orders placed or performed during the hospital encounter of 04/09/19 (from the past 48 hour(s))  Basic metabolic panel     Status: Abnormal   Collection Time: 04/09/19  9:46 AM  Result Value Ref Range   Sodium 135 135 - 145 mmol/L   Potassium 4.0 3.5 - 5.1 mmol/L   Chloride 100 98 - 111 mmol/L   CO2 22 22 - 32 mmol/L   Glucose, Bld 409 (H) 70 - 99 mg/dL   BUN 9 6 - 20 mg/dL   Creatinine, Ser 5.950.63 0.61 - 1.24 mg/dL   Calcium 9.4 8.9 - 63.810.3 mg/dL   GFR calc non Af Amer >60 >60 mL/min   GFR calc Af Amer >60 >60 mL/min   Anion gap 13 5 - 15    Comment: Performed at [facility], [address] [place], [place] [address]  CBC     Status: Abnormal   Collection Time: 04/09/19  9:46 AM  Result Value Ref Range   WBC 6.0 4.0 - 10.5 K/uL   RBC 5.23  4.22 - 5.81 MIL/uL   Hemoglobin 17.1 (H) 13.0 - 17.0 g/dL   HCT 40.946.8 81.139.0 - 91.452.0 %   MCV 89.5 80.0 - 100.0 fL   MCH 32.7 26.0 - 34.0 pg   MCHC 36.5 (H) 30.0 - 36.0 g/dL   RDW 78.211.7 95.611.5 - 21.315.5 %   Platelets 204 150 - 400 K/uL   nRBC 0.0 0.0 - 0.2 %    Comment: Performed at [facility], [address] [place], [place] [address]  Troponin I (High Sensitivity)     Status: Abnormal   Collection Time: 04/09/19  9:46 AM  Result Value Ref Range   Troponin I (High Sensitivity) 134 (HH) <18 ng/L    Comment: CRITICAL RESULT CALLED TO, READ BACK BY AND VERIFIED WITH Taim Wurm WALLACE AT 1017 ON 04/09/2019 JJB (NOTE) Elevated high sensitivity troponin I (hsTnI) values and significant  changes across serial measurements may suggest ACS but many other  chronic and acute conditions are known to elevate hsTnI results.  Refer  to the "Links" section for chest pain algorithms and additional  guidance. Performed at [facility], [address] [place], [place] [address]   Urine Drug Screen, Qualitative [facility] only)     Status: None   Collection Time: 04/09/19  9:56 AM  Result Value Ref Range   Tricyclic, Ur Screen NONE DETECTED NONE DETECTED   Amphetamines, Ur Screen NONE DETECTED NONE DETECTED   MDMA (Ecstasy)Ur Screen NONE DETECTED NONE DETECTED   Cocaine Metabolite,Ur [place] NONE DETECTED NONE DETECTED   Opiate, Ur Screen NONE DETECTED NONE DETECTED   Phencyclidine (PCP) Ur S NONE DETECTED NONE DETECTED   Cannabinoid 50 Ng, Ur [place] NONE DETECTED NONE DETECTED   Barbiturates, Ur Screen NONE DETECTED NONE DETECTED   Benzodiazepine, Ur Scrn NONE DETECTED NONE DETECTED   Methadone Scn, Ur NONE DETECTED NONE DETECTED    Comment: (NOTE) Tricyclics + metabolites, urine    Cutoff 1000 ng/mL Amphetamines + metabolites, urine  Cutoff 1000 ng/mL MDMA (Ecstasy), urine              Cutoff 500 ng/mL Cocaine Metabolite, urine          Cutoff 300 ng/mL Opiate + metabolites, urine        Cutoff 300 ng/mL Phencyclidine (PCP), urine         Cutoff 25 ng/mL Cannabinoid, urine                 Cutoff 50 ng/mL Barbiturates + metabolites, urine  Cutoff 200 ng/mL Benzodiazepine, urine              Cutoff 200 ng/mL Methadone, urine                   Cutoff 300 ng/mL The urine drug screen provides only a preliminary, unconfirmed analytical test result and should not be used for non-medical purposes. Clinical consideration and professional judgment should be applied to any positive drug screen result due to possible interfering substances. A more specific alternate chemical method must be used in order to obtain a confirmed analytical result. Gas chromatography / mass spectrometry (GC/MS) is the preferred confirmat ory method. Performed at [facility], [address] [place], [place] [address]    Dg Chest  Port 1 View  Result Date: 04/09/2019 CLINICAL DATA:  Chest pain EXAM: PORTABLE CHEST 1 VIEW COMPARISON:  12/20/2015 FINDINGS: Normal heart size and mediastinal contours. No acute infiltrate or edema. No effusion or pneumothorax. No acute osseous  findings. IMPRESSION: Negative chest. Electronically Signed   By: Monte Fantasia M.D.   On: 04/09/2019 09:44    Pending Labs Unresulted Labs (From admission, onward)    Start     Ordered   04/09/19 1133  Lipid panel  Add-on,   AD     04/09/19 1132   04/09/19 1110  Hemoglobin A1c  Once,   STAT    Comments: To assess prior glycemic control    04/09/19 1110   04/09/19 1051  SARS Coronavirus 2 (CEPHEID - Performed in [facility] hospital lab), [facility]  Once,   STAT    Question:  Rule Out  Answer:  Yes   04/09/19 1050   Signed and Held  HIV antibody (Routine Testing)  Once,   R     Signed and Held   Signed and Held  TSH  Once,   R     Signed and Held   Signed and Held  Hemoglobin A1c  Once,   R     Signed and Held   Signed and Held  Lipid panel  Tomorrow morning,   R     Signed and Held   Signed and Held  CBC  Tomorrow morning,   R     Signed and Held   Signed and Held  Basic metabolic panel  Tomorrow morning,   R     Signed and Held          Vitals/Pain Today's Vitals   04/09/19 1000 04/09/19 1030 04/09/19 1100 04/09/19 1130  BP: (!) 134/96 (!) 167/104 (!) 158/93 (!) 159/105  Pulse: 95     Resp:  19 12 13   Temp:      TempSrc:      SpO2: 96%     Weight:      Height:      PainSc:        Isolation Precautions No active isolations  Medications Medications  heparin ADULT infusion 100 units/mL (25000 units/242mL sodium chloride 0.45%) (1,000 Units/hr Intravenous New Bag/Given 04/09/19 1113)  insulin aspart (novoLOG) injection 0-9 Units (has no administration in time range)  insulin aspart (novoLOG) injection 0-5 Units (has no administration in time range)  metoprolol tartrate (LOPRESSOR) tablet 12.5 mg (has no administration  in time range)  0.9 %  sodium chloride infusion ( Intravenous New Bag/Given 04/09/19 0958)  aspirin chewable tablet 324 mg (324 mg Oral Given 04/09/19 1051)  nitroGLYCERIN (NITROGLYN) 2 % ointment 1 inch (1 inch Topical Given 04/09/19 1053)  insulin aspart (novoLOG) injection 5 Units (5 Units Subcutaneous Given 04/09/19 1108)  heparin bolus via infusion 4,000 Units (4,000 Units Intravenous Bolus from Bag 04/09/19 1114)    Mobility walks Low fall risk   Focused Assessments Cardiac Assessment Handoff:  Cardiac Rhythm: Sinus tachycardia Lab Results  Component Value Date   TROPONINI <0.03 03/13/2017   No results found for: DDIMER Does the Patient currently have chest pain? Yes     R Recommendations: See Admitting Provider Note  Report given to:   Additional Notes:

## 2019-04-10 ENCOUNTER — Telehealth: Payer: Self-pay | Admitting: Internal Medicine

## 2019-04-10 ENCOUNTER — Other Ambulatory Visit: Payer: Self-pay

## 2019-04-10 ENCOUNTER — Encounter: Payer: Self-pay | Admitting: Internal Medicine

## 2019-04-10 DIAGNOSIS — E782 Mixed hyperlipidemia: Secondary | ICD-10-CM

## 2019-04-10 LAB — CBC
HCT: 42.9 % (ref 39.0–52.0)
Hemoglobin: 15.2 g/dL (ref 13.0–17.0)
MCH: 32.7 pg (ref 26.0–34.0)
MCHC: 35.4 g/dL (ref 30.0–36.0)
MCV: 92.3 fL (ref 80.0–100.0)
Platelets: 193 10*3/uL (ref 150–400)
RBC: 4.65 MIL/uL (ref 4.22–5.81)
RDW: 11.9 % (ref 11.5–15.5)
WBC: 6.4 10*3/uL (ref 4.0–10.5)
nRBC: 0 % (ref 0.0–0.2)

## 2019-04-10 LAB — HEPATIC FUNCTION PANEL
ALT: 48 U/L — ABNORMAL HIGH (ref 0–44)
AST: 35 U/L (ref 15–41)
Albumin: 3.6 g/dL (ref 3.5–5.0)
Alkaline Phosphatase: 66 U/L (ref 38–126)
Bilirubin, Direct: <0.1 mg/dL (ref 0.0–0.2)
Total Bilirubin: 1.2 mg/dL (ref 0.3–1.2)
Total Protein: 6.5 g/dL (ref 6.5–8.1)

## 2019-04-10 LAB — BASIC METABOLIC PANEL
Anion gap: 8 (ref 5–15)
BUN: 10 mg/dL (ref 6–20)
CO2: 22 mmol/L (ref 22–32)
Calcium: 8.2 mg/dL — ABNORMAL LOW (ref 8.9–10.3)
Chloride: 105 mmol/L (ref 98–111)
Creatinine, Ser: 0.52 mg/dL — ABNORMAL LOW (ref 0.61–1.24)
GFR calc Af Amer: >60 mL/min (ref >60)
GFR calc non Af Amer: >60 mL/min (ref >60)
Glucose, Bld: 303 mg/dL — ABNORMAL HIGH (ref 70–99)
Potassium: 3.5 mmol/L (ref 3.5–5.1)
Sodium: 135 mmol/L (ref 135–145)

## 2019-04-10 LAB — LIPID PANEL
Cholesterol: 271 mg/dL — ABNORMAL HIGH (ref 0–200)
HDL: 28 mg/dL — ABNORMAL LOW (ref >40)
LDL Cholesterol: UNDETERMINED mg/dL (ref 0–99)
Total CHOL/HDL Ratio: 9.7 RATIO
Triglycerides: 673 mg/dL — ABNORMAL HIGH (ref <150)
VLDL: UNDETERMINED mg/dL (ref 0–40)

## 2019-04-10 LAB — GLUCOSE, CAPILLARY
Glucose-Capillary: 281 mg/dL — ABNORMAL HIGH (ref 70–99)
Glucose-Capillary: 293 mg/dL — ABNORMAL HIGH (ref 70–99)
Glucose-Capillary: 305 mg/dL — ABNORMAL HIGH (ref 70–99)
Glucose-Capillary: 316 mg/dL — ABNORMAL HIGH (ref 70–99)
Glucose-Capillary: 327 mg/dL — ABNORMAL HIGH (ref 70–99)
Glucose-Capillary: 373 mg/dL — ABNORMAL HIGH (ref 70–99)

## 2019-04-10 LAB — HIV ANTIBODY (ROUTINE TESTING W REFLEX): HIV Screen 4th Generation wRfx: NONREACTIVE

## 2019-04-10 LAB — ECHOCARDIOGRAM COMPLETE
Height: 70 in
Weight: 2944 oz

## 2019-04-10 MED ORDER — INSULIN GLARGINE 100 UNIT/ML ~~LOC~~ SOLN
16.0000 [IU] | Freq: Every day | SUBCUTANEOUS | Status: DC
  Administered 2019-04-10 – 2019-04-11 (×2): 16 [IU] via SUBCUTANEOUS
  Filled 2019-04-10 (×2): qty 0.16

## 2019-04-10 MED ORDER — EZETIMIBE 10 MG PO TABS
10.0000 mg | ORAL_TABLET | Freq: Every day | ORAL | Status: DC
  Administered 2019-04-10: 17:00:00 10 mg via ORAL
  Filled 2019-04-10: qty 1

## 2019-04-10 MED ORDER — LISINOPRIL 5 MG PO TABS
2.5000 mg | ORAL_TABLET | Freq: Every day | ORAL | Status: DC
  Administered 2019-04-10 – 2019-04-11 (×2): 2.5 mg via ORAL
  Filled 2019-04-10 (×2): qty 1

## 2019-04-10 NOTE — Progress Notes (Signed)
Progress Note  Patient Name: Matthew Blevins Date of Encounter: 04/10/2019  Primary Cardiologist: [facility], Dr. Okey DupreEnd  Subjective   Patient reported that he did not sleep well last night.  He reported mild anterior and central chest pain, located in the upper abdomen/lower chest area and described as bilateral tightness.  He also reported continued headache that was not associated with the nitroglycerin patch.  No shortness of breath, racing heart rate, or palpitations.  He denied any pain at his left radial catheterization site.  He referenced FMLA and we discussed finding a PCP for this and follow-up of his chronic medical conditions.  We discussed aggressive risk factor modification and control of comorbid conditions.  Inpatient Medications    Scheduled Meds: . aspirin EC  81 mg Oral Daily  . clopidogrel  75 mg Oral Q breakfast  . enoxaparin (LOVENOX) injection  40 mg Subcutaneous Q24H  . insulin aspart  0-5 Units Subcutaneous QHS  . insulin aspart  0-9 Units Subcutaneous TID WC  . isosorbide mononitrate  30 mg Oral Daily  . metoprolol tartrate  25 mg Oral BID  . rosuvastatin  5 mg Oral q1800  . sodium chloride flush  3 mL Intravenous Q12H  . sodium chloride flush  3 mL Intravenous Q12H   Continuous Infusions: . sodium chloride     PRN Meds: sodium chloride, acetaminophen, ALPRAZolam, nitroGLYCERIN, ondansetron (ZOFRAN) IV, sodium chloride flush   Vital Signs    Vitals:   04/09/19 1956 04/10/19 0156 04/10/19 0509 04/10/19 0756  BP: 127/84 119/82 119/89 121/82  Pulse: 81 79 80 76  Resp:  18  18  Temp: 98.2 F (36.8 C) 98.2 F (36.8 C) 97.8 F (36.6 C) 98 F (36.7 C)  TempSrc: Oral Oral Oral Oral  SpO2: 100%  98% 100%  Weight:      Height:        Intake/Output Summary (Last 24 hours) at 04/10/2019 0859 Last data filed at 04/10/2019 0709 Gross per 24 hour  Intake 1000 ml  Output 850 ml  Net 150 ml   Last 3 Weights 04/09/2019 04/09/2019 04/09/2019  Weight  (lbs) 184 lb 184 lb 3.2 oz 178 lb  Weight (kg) 83.462 kg 83.553 kg 80.74 kg      Telemetry    SR- Personally Reviewed  ECG   No new tracings - Personally Reviewed  Physical Exam   GEN: No acute distress.   Neck: No JVD Cardiac: RRR, no murmurs, rubs, or gallops.  Left radial catheterization site without signs of infection or hematoma Respiratory: Clear to auscultation bilaterally. GI: Soft, nontender, non-distended  MS: No edema; No deformity. Neuro:  Nonfocal  Psych: Normal affect   Labs    High Sensitivity Troponin:   Recent Labs  Lab 04/09/19 0946 04/09/19 1307 04/09/19 1840  TROPONINIHS 134* 163* 159*      Cardiac EnzymesNo results for input(s): TROPONINI in the last 168 hours. No results for input(s): TROPIPOC in the last 168 hours.   Chemistry Recent Labs  Lab 04/09/19 0946 04/10/19 0523  NA 135 135  K 4.0 3.5  CL 100 105  CO2 22 22  GLUCOSE 409* 303*  BUN 9 10  CREATININE 0.63 0.52*  CALCIUM 9.4 8.2*  GFRNONAA >60 >60  GFRAA >60 >60  ANIONGAP 13 8     Hematology Recent Labs  Lab 04/09/19 0946 04/10/19 0523  WBC 6.0 6.4  RBC 5.23 4.65  HGB 17.1* 15.2  HCT 46.8 42.9  MCV 89.5  92.3  MCH 32.7 32.7  MCHC 36.5* 35.4  RDW 11.7 11.9  PLT 204 193    BNPNo results for input(s): BNP, PROBNP in the last 168 hours.   DDimer No results for input(s): DDIMER in the last 168 hours.   Radiology    Dg Chest Port 1 View  Result Date: 04/09/2019 CLINICAL DATA:  Chest pain EXAM: PORTABLE CHEST 1 VIEW COMPARISON:  12/20/2015 FINDINGS: Normal heart size and mediastinal contours. No acute infiltrate or edema. No effusion or pneumothorax. No acute osseous findings. IMPRESSION: Negative chest. Electronically Signed   By: Monte Fantasia M.D.   On: 04/09/2019 09:44    Cardiac Studies  LHC 04/09/2019 Conclusions: 1. Severe single-vessel coronary artery disease with subtotal occlusion of OM2 with TIMI-1 flow.  This vessel is small (less than 2 mm in  diameter) and is not well-suited for PCI.  There is mild to moderate, non-obstructive coronary artery disease in the remainder of the LCx and RCA. 2. Widely patent ostial/proximal LAD stent. 3. Mid anterior hypokinesis with otherwise preserved left ventricular systolic function (LVEF 45 to 50%). 4. Mildly elevated left ventricular filling pressure. Recommendations: 1. Medical therapy of NSTEMI, including dual antiplatelet therapy with aspirin and clopidogrel for 12 months. 2. Discontinue nitroglycerin paste and add isosorbide mononitrate 30 mg daily for antianginal therapy. 3. Continue current dose of metoprolol; consider adding ACE inhibitor or ARB tomorrow, as blood pressure and renal function tolerate. 4. Aggressive secondary prevention, including statin therapy.  If patient is intolerant of low-dose rosuvastatin, PCSK9 inhibitor will need to be considered as an outpatient.  Given marked hypertriglyceridemia, addition of Vascepa for secondary prevention should also be considered. 5. Likely discharge home tomorrow.  8/17: LHC Diagnostic Findings Coronary arteries Dominance: co-dominant Left main: normal LAD: prox 95% - TIMI 2 LCx: OM2 60% - medium; OM3 50% - large RCA: insignificant Impressions 95% "widow-maker" prox LAD disease Minor diffuse disease elsewhere Successful PCI Lesion: 95% prox LAD Devices: 2.55mm balloon (pre-dil), 3.5mm x49mm Xience Sierra (drug-eluting) stent Result: 95% TIMI2 to normal (0%) TIMI 3 flow   8/17: TTE:  LEFT VENTRICLE                   Anterior: HYPOCONTRACTILE    Size: SMALL                 Lateral: HYPOCONTRACTILE Contraction: REGIONALLY IMPAIRED           Septal: Normal Closest EF: 45% (Estimated) Calc.EF: 43% (3D)   Apical: HYPOCONTRACTILE  LV masses: No Masses               Inferior: Normal     LVH: MILD LVH CONCENTRIC         Posterior: Normal Dias.FxClass:  RELAXATION ABNORMALITY (GRADE 1) CORRESPONDS TO E/A REVERSAL  INTERPRETATION  MILD LV DYSFUNCTION (See above) WITH MILD LVH NORMAL RIGHT VENTRICULAR SYSTOLIC FUNCTION VALVULAR REGURGITATION: TRIVIAL MR, TRIVIAL TR NO VALVULAR STENOSIS NO PRIOR STUDY FOR COMPARISON   Patient Profile     53 y.o. male hx of CAD s/p 2017 DES to pLAD, HFrEF (EF 45%), hypertension, hyperlipidemia, DM2, alcohol abuse (2015), mood disorder GERD, herniated disc/ L3 (2015), and erectile dysfunction who is being seen today for the evaluation of NSTEMI s/p LHC 04/09/2019.  Assessment & Plan    NSTEMI --Mild anterior and central chest pain in the upper abdomen/lower chest area.  Atypical.  Occasionally feels better with deep breathing.  Also reported headache but not associated with nitro. --Tn  peaked at 163, now down-trending. Initial EKG sinus tachycardia without acute ST/T changes. Tachycardic and hypertensive at presentation. --LHC 7/14 as above and without intervention. Medical therapy recommended, including DAPT with ASA and clopidogrel for at least 12 months. --At discharge, continue ASA, clopidogrel, BB, statin, Imdur, SL nitro PRN. Nitropaste discontinued. Consider addition of ACE/ARB given HTN and DM2 with slightly reduced EF and as room in BP with f/u BMET within 1 week. --Ordered baseline liver function, given statin start. F/u lipid and liver recommended in 6-8 weeks. If intolerant to statin, PCSK9i recommended as outpatient. Given elevated triglycerides as below, addition of Vascepa for secondary prevention could also be considered. --Renal function stable. Replete potassium as below.  --Will need scheduled for TCM within 2 weeks in the office. --Discussed medication compliance and finding a PCP. Patient has not taken any cardiac or DM2 medications for at least 1 year.  CAD s/p pLAD DES - Current CP, as above --Recommendations as above s/p cath --Schedule office follow-up and labs as above   HFrEF --Not volume overloaded on exam. No current SOB. --Echo ordered, pending. Cath showed EF 45-50%.  --Started on low dose BB. Recommend addition of ACE/ARB as renal function and BP allow with follow-up labs as above.   Uncontrolled DM2 --A1C severely elevated --Recommend restart of medications / metformin at discharge --Discussed f/u / finding PCP  Hypokalemia - K 3.5 --Replete with goal 4.0  HLD --Updated lipid panel ordered with LDL unable to be assessed d/t elevated triglycerides. 2018 LDL 137 --Statin therapy recommended -obtaining baseline liver function and with f/u lipid and liver in 6-8 weeks  Hypertriglyceridemia --As above, could consider Vascepa as outpatient.  HTN, uncontrolled --Started on BB with recommendation for ACE/ARB before discharge as above.  For questions or updates, please contact [facility] Please consult [email or web address] for contact info under        Signed, Lennon AlstromJacquelyn D Deaisha Welborn, PA-C  04/10/2019, 8:59 AM

## 2019-04-10 NOTE — Telephone Encounter (Signed)
LMOV to schedule appointment  

## 2019-04-10 NOTE — Plan of Care (Signed)
Up in room without distress noted.

## 2019-04-10 NOTE — Progress Notes (Signed)
Cardiovascular and Pulmonary Nurse Navigator Note:    53 year old male with hx of CAD s/p 2017 DES to pLAD, HFrEF (EF 45%), HTN, HLD, DM2, alcohol abuse (2015), mood disorder, GERD, herniated disc L3 (2015) and erectile dysfunction who was admitted for NSTEMI.  Patient underwent LHC on 04/09/2019.    LEFT HEART CATH AND CORONARY ANGIOGRAPHY  Conclusion  Conclusions: 1. Severe single-vessel coronary artery disease with subtotal occlusion of OM2 with TIMI-1 flow.  This vessel is small (less than 2 mm in diameter) and is not well-suited for PCI.  There is mild to moderate, non-obstructive coronary artery disease in the remainder of the LCx and RCA. 2. Widely patent ostial/proximal LAD stent. 3. Mid anterior hypokinesis with otherwise preserved left ventricular systolic function (LVEF 45 to 50%). 4. Mildly elevated left ventricular filling pressure.  Recommendations: 1. Medical therapy of NSTEMI, including dual antiplatelet therapy with aspirin and clopidogrel for 12 months. 2. Discontinue nitroglycerin paste and add isosorbide mononitrate 30 mg daily for antianginal therapy. 3. Continue current dose of metoprolol; consider adding ACE inhibitor or ARB tomorrow, as blood pressure and renal function tolerate. 4. Aggressive secondary prevention, including statin therapy.  If patient is intolerant of low-dose rosuvastatin, PCSK9 inhibitor will need to be considered as an outpatient.  Given marked hypertriglyceridemia, addition of Vascepa for secondary prevention should also be considered. 5. Likely discharge home tomorrow.  Nelva Bush, MD [place] Pager: [phone]    EDUCATION:  "Heart Attack Bouncing Back" booklet given and reviewed with patient. Discussed the definition of CAD. Reviewed the location of CAD.  NSTEMI being medically managed.  See Cardiac Cath report above.   ? Discussed modifiable risk factors including controlling blood pressure, cholesterol, and blood sugar;  following heart healthy diet; maintaining healthy weight; exercise; and smoking cessation, if applicable.   ? Discussed cardiac medications including rationale for taking, mechanisms of action, and side effects. Stressed the importance of taking medications as prescribed. Patient at this time is on the followiong cardiac medications:   ASA, Plavix, Metoprolol, Crestor (low-dose due to statin intolerance), Imdur. ACE may be added prior to discharge according to Cardiologist's note.   Patient informed this RN that he took his medications for one year after he had his cardiac stent placed in 2017.  He stated he stopped taking medications after a year because he just got tired of taking all those medications.   ? Encouraged patient to take medications as prescribed and stressed the fact that medications are important to preventing previous stent from becoming blocked and to prevent further blockages in coronary arteries along with controlling other risk factors (HTN, HLD, DM).  ? Discussed emergency plan for heart attack symptoms. Patient verbalized understanding of need to call 911 and not to drive himself to ER if having cardiac symptoms / chest pain.  Patient is currently on IMDUR.   ? Diet of low sodium, low fat, low cholesterol heart healthy / carb modified diet discussed. Information on diet provided.  ? Smoking Cessation - Patient is a NEVER smoker.    ? Exercise - Benefits of exercised discussed.   Patient informed this RN he exercised after his stent was placed and he even got his HGB A1C down to 4.  He later stopped exercising and stopped taking his medication. Patient stated, "I just got tired of taking all those medications.  My wife has laid down the "law" down this time.  We have been married 27 years."   Informed patient his  cardiologist has referred him to outpatient Cardiac Rehab. Patient informed this RN he did not participate in Cardiac Rehab when he had [facility]  [facility].  An overview of the program was provided along with the benefits of participating in  Cardiac Rehab.   Patient stated, "Where I work it's not easy to modify my schedule.  I work 7:30 a.m. to 4 p.m.  Also, I really already know what to do because I did it before. I will think about participating."  Patient agreeable to being contacted by [facility] in one to two weeks to discuss Cardiac Rehab.    Patient appreciative of the information.  ? Army Meliaiane Vin Yonke, RN, BSN, [facility]  [place]  [facility] Cardiac & Pulmonary Rehab  Cardiovascular & Pulmonary Nurse Navigator  Direct Line: [phone]  Department Phone #: [phone] Fax: [phone]  Email Address: [email or web address]

## 2019-04-10 NOTE — Progress Notes (Signed)
[facility] [place] NAME: Matthew Blevins    MR#:  [id number]  DATE OF BIRTH:  1966/04/13  SUBJECTIVE:  CHIEF COMPLAINT:  Pt is resting comfortably.  Denies any chest pain.  Noncompliant with medications, says he is forgetful  REVIEW OF SYSTEMS:  CONSTITUTIONAL: No fever, fatigue or weakness.  EYES: No blurred or double vision.  EARS, NOSE, AND THROAT: No tinnitus or ear pain.  RESPIRATORY: No cough, shortness of breath, wheezing or hemoptysis.  CARDIOVASCULAR: No chest pain, orthopnea, edema.  GASTROINTESTINAL: No nausea, vomiting, diarrhea or abdominal pain.  GENITOURINARY: No dysuria, hematuria.  ENDOCRINE: No polyuria, nocturia,  HEMATOLOGY: No anemia, easy bruising or bleeding SKIN: No rash or lesion. MUSCULOSKELETAL: No joint pain or arthritis.   NEUROLOGIC: No tingling, numbness, weakness.  PSYCHIATRY: No anxiety or depression.   DRUG ALLERGIES:   Allergies  Allergen Reactions  . Statins Other (See Comments)    Joint and muscle pain    VITALS:  Blood pressure 107/74, pulse 75, temperature 98 F (36.7 C), temperature source Oral, resp. rate 18, height 5\' 10"  (1.778 m), weight 83.5 kg, SpO2 100 %.  PHYSICAL EXAMINATION:  GENERAL:  53 y.o.-year-old patient lying in the bed with no acute distress.  EYES: Pupils equal, round, reactive to light and accommodation. No scleral icterus. Extraocular muscles intact.  HEENT: Head atraumatic, normocephalic. Oropharynx and nasopharynx clear.  NECK:  Supple, no jugular venous distention. No thyroid enlargement, no tenderness.  LUNGS: Normal breath sounds bilaterally, no wheezing, rales,rhonchi or crepitation. No use of accessory muscles of respiration.  CARDIOVASCULAR: S1, S2 normal. No murmurs, rubs, or gallops.  ABDOMEN: Soft, nontender, nondistended. Bowel sounds present.  EXTREMITIES: No pedal edema, cyanosis, or clubbing.  NEUROLOGIC: Cranial nerves II through XII are  intact. Muscle strength 5/5 in all extremities. Sensation intact. Gait not checked.  PSYCHIATRIC: The patient is alert and oriented x 3.  SKIN: No obvious rash, lesion, or ulcer.    LABORATORY PANEL:   CBC Recent Labs  Lab 04/10/19 0523  WBC 6.4  HGB 15.2  HCT 42.9  PLT 193   ------------------------------------------------------------------------------------------------------------------  Chemistries  Recent Labs  Lab 04/10/19 0523 04/10/19 1007  NA 135  --   K 3.5  --   CL 105  --   CO2 22  --   GLUCOSE 303*  --   BUN 10  --   CREATININE 0.52*  --   CALCIUM 8.2*  --   AST  --  35  ALT  --  48*  ALKPHOS  --  66  BILITOT  --  1.2   ------------------------------------------------------------------------------------------------------------------  Cardiac Enzymes No results for input(s): TROPONINI in the last 168 hours. ------------------------------------------------------------------------------------------------------------------  RADIOLOGY:  Dg Chest Port 1 View  Result Date: 04/09/2019 CLINICAL DATA:  Chest pain EXAM: PORTABLE CHEST 1 VIEW COMPARISON:  12/20/2015 FINDINGS: Normal heart size and mediastinal contours. No acute infiltrate or edema. No effusion or pneumothorax. No acute osseous findings. IMPRESSION: Negative chest. Electronically Signed   By: Monte Fantasia M.D.   On: 04/09/2019 09:44    EKG:   Orders placed or performed during the hospital encounter of 04/09/19  . ED EKG  . ED EKG  . EKG 12-Lead  . EKG 12-Lead  . EKG 12-Lead  . EKG 12-Lead  . EKG 12-Lead  . EKG 12-Lead    ASSESSMENT AND PLAN:    Patient is 53 year old presenting with chest pain  1.    Non-STEMI  Mild elevated cardiac enzymes which are not trending Initially started on heparin drip which is discontinued Status post cardiac cath-subtotally occluded obtuse marginal branch #2.  No further cardiac interventions are needed medical management is advised Continue  aspirin, Plavix.  Started on beta-blocker metoprolol, lisinopril Patient states that he could not tolerate statins but no specific allergy.  We will treat with low-dose Crestor, Zetia Cardiology is considering to arrange PCSK9 inhibitor outpatient.  Plan of care discussed with Dr. Mariah MillingGollan  2.  Diabetes type 2 with blood sugars elevated noncompliant with medications Hemoglobin A1c 11.7 Reinforced the importance of being compliant with his medications patient is agreeable to take his insulin Diabetic coordinator is following education provided Outpatient follow-up with endocrinology patient is agreeable   3.  Hyperlipidemia with hypertriglyceridemia-triglycerides at 673 LDL cannot be calculated Low-dose statin and Zetia.  Outpatient follow-up with cardiology recommending Vascepa as an outpatient  4.  Hypertension -on metoprolol will adjust his medications as needed  5.  Heparin for DVT prophylaxis      All the records are reviewed and case discussed with Care Management/Social Workerr. Management plans discussed with the patient, VM LEFT  TO WIFE to call back   CODE STATUS: fc  TOTAL TIME TAKING CARE OF THIS PATIENT: 36 minutes.   POSSIBLE D/C IN 1  DAYS, DEPENDING ON CLINICAL CONDITION.  Note: This dictation was prepared with Dragon dictation along with smaller phrase technology. Any transcriptional errors that result from this process are unintentional.   Matthew Blevins M.D on 04/10/2019 at 3:31 PM  Between 7am to 6pm - Pager - [phone] After 6pm go to [email or web address] - password [email or web address]  [facility]  Office  [phone]  CC: Primary care physician; [place]

## 2019-04-10 NOTE — Progress Notes (Addendum)
Inpatient Diabetes Program Recommendations  AACE/ADA: New Consensus Statement on Inpatient Glycemic Control  Target Ranges:  Prepandial:   less than 140 mg/dL      Peak postprandial:   less than 180 mg/dL (1-2 hours)      Critically ill patients:  140 - 180 mg/dL   Results for EXANDER, SHAUL (MRN [id number]) as of 04/10/2019 08:59  Ref. Range 04/09/2019 12:29 04/09/2019 14:48 04/09/2019 16:36 04/09/2019 21:03 04/10/2019 00:55 04/10/2019 05:07 04/10/2019 08:29  Glucose-Capillary Latest Ref Range: 70 - 99 mg/dL 278 (H) 218 (H) 198 (H) 383 (H) 316 (H) 281 (H) 305 (H)  Results for RAVINDER, LUKEHART (MRN [id number]) as of 04/10/2019 08:59  Ref. Range 04/09/2019 13:07  Hemoglobin A1C Latest Ref Range: 4.8 - 5.6 % 11.7 (H)   Review of Glycemic Control  Diabetes history: DM2 Outpatient Diabetes medications: Basaglar 20 units daily as needed, Metformin 1000 mg BID (not taking Basaglar and Metformin) Current orders for Inpatient glycemic control: Novolog 0-9 units TID with meals, Novolog 0-5 units QHS  Inpatient Diabetes Program Recommendations:   Insulin - Basal: Please consider ordering Lantus 16 units Q24H starting now (based on 83.5 kg x 0.2 units).  HgbA1C: A1C 11.7% on 04/09/19 indicating an average glucose of 289 mg/dl over the past 2-3 months.  Addendum 04/10/19'@13' :30-Spoke with patient about diabetes and home regimen for diabetes control. Patient reports he has not taken any DM medication in over 1 1/2 years. Patient states that he use to take Basaglar 20 units QAM if needed and Metformin 1000 mg BID for diabetes control. Patient states that in the past he after he had heart issues and stents placed he made changes with his diet and exercise and was able to get his A1C down to 4% so he did not need the insulin. Patient admits that he has not taken his health serious lately and he has not seen a provider in over a year and a half.  Patient has insurance and has been in the process of trying to get an  appointment to see a new provider to establish primary care. Patient has not been checking glucose at home (will need Rx for a new glucometer and testing supplies) but he reports having symptoms of hyperglycemia and he knew his glucose was running high by how he was feeling.   Discussed A1C results (11.7% on 04/09/19 ) and explained that current A1C indicates an average glucose of 289 mg/dl over the past 2-3 months. Discussed glucose and A1C goals. Discussed importance of checking CBGs and maintaining good CBG control to prevent long-term and short-term complications. Explained how hyperglycemia leads to damage within blood vessels which lead to the common complications seen with uncontrolled diabetes. Stressed to the patient the importance of improving glycemic control to prevent further complications from uncontrolled diabetes. Discussed impact of nutrition, exercise, stress, sickness, and medications on diabetes control.  Discussed carbohydrates, carbohydrate goals per day and meal, along with portion sizes. Patient states he realizes that he has got to get back on track with taking care of himself and all his health issues. Patient states he is willing to take insulin again and he reports that he does not have any financial barriers to being able to get everything he needs for DM management. Patient prefers to use insulin pens.   Encouraged patient to check glucose 3-4 times per day (before meals and at bedtime) and to keep a log book of glucose readings and DM medication taken which patient will need  to take to doctor appointments. Explained how the doctor can use the log book to continue to make adjustments with DM medications if needed.  Patient verbalized understanding of information discussed and reports no further questions at this time related to diabetes. At time of discharge, please provide Rx for: glucose monitoring kit (# [id number]), [name] [phone]), and insulin pen needles  [phone]).  Thanks, Barnie Alderman, RN, MSN, CDE Diabetes Coordinator Inpatient Diabetes Program [phone] (Team Pager from 8am to 5pm)

## 2019-04-10 NOTE — Progress Notes (Signed)
Notified by CCMD of NSVT on telemetry.  Pt awakened for VS.  VSS, denies pain or palpitations.  Skin warm, dry.  Dr Jannifer Franklin made aware.  No new orders.  Will continue to monitor.

## 2019-04-10 NOTE — Telephone Encounter (Signed)
-----   Message from Arvil Chaco, PA-C sent at 04/10/2019 10:39 AM EDT ----- Regarding: TCM Hello,  This patient was admitted and seen at [facility] for NSTEMI with [place].  We are expecting discharge today 7/15.  Can you please call and arrange / schedule for follow-up TCM appointment with Dr. Saunders Revel within the next two weeks?   He will also need follow-up BMET ordered / scheduled within 1 week. Lipid and liver function should also be checked in 6-8 weeks.   Thank you! Signed, Arvil Chaco, PA-C 04/10/2019, 10:40 AM Pager [phone]

## 2019-04-10 NOTE — Plan of Care (Signed)
  Problem: Cardiac: Goal: Vascular access site(s) Level 0-1 will be maintained Outcome: Progressing  Left wrist without bleed/hematoma. LUE remains warm to touch.

## 2019-04-11 LAB — GLUCOSE, CAPILLARY
Glucose-Capillary: 243 mg/dL — ABNORMAL HIGH (ref 70–99)
Glucose-Capillary: 327 mg/dL — ABNORMAL HIGH (ref 70–99)

## 2019-04-11 LAB — LDL CHOLESTEROL, DIRECT: Direct LDL: 124.6 mg/dL — ABNORMAL HIGH (ref 0–99)

## 2019-04-11 MED ORDER — NITROGLYCERIN 0.4 MG SL SUBL: 0.4000 mg | SUBLINGUAL_TABLET | SUBLINGUAL | 0 refills | Status: AC | PRN

## 2019-04-11 MED ORDER — CITALOPRAM HYDROBROMIDE 10 MG PO TABS: 10.0000 mg | ORAL_TABLET | Freq: Every day | ORAL | 0 refills | Status: AC

## 2019-04-11 MED ORDER — METOPROLOL TARTRATE 25 MG PO TABS: 25.0000 mg | ORAL_TABLET | Freq: Two times a day (BID) | ORAL | 0 refills | Status: DC

## 2019-04-11 MED ORDER — CLOPIDOGREL BISULFATE 75 MG PO TABS: 75.0000 mg | ORAL_TABLET | Freq: Every day | ORAL | 0 refills | Status: DC

## 2019-04-11 MED ORDER — EZETIMIBE 10 MG PO TABS: 10.0000 mg | ORAL_TABLET | Freq: Every day | ORAL | 1 refills | Status: AC

## 2019-04-11 MED ORDER — BASAGLAR KWIKPEN 100 UNIT/ML ~~LOC~~ SOPN: 25.0000 [IU] | PEN_INJECTOR | Freq: Every day | SUBCUTANEOUS | 1 refills | Status: AC

## 2019-04-11 MED ORDER — INSULIN GLARGINE 100 UNIT/ML ~~LOC~~ SOLN
9.0000 [IU] | Freq: Once | SUBCUTANEOUS | Status: AC
  Administered 2019-04-11: 9 [IU] via SUBCUTANEOUS
  Filled 2019-04-11: qty 0.09

## 2019-04-11 MED ORDER — LIVING WELL WITH DIABETES BOOK
Freq: Once | Status: AC
  Administered 2019-04-11: 04:00:00
  Filled 2019-04-11: qty 1

## 2019-04-11 MED ORDER — ASPIRIN 81 MG PO TBEC: 81.0000 mg | DELAYED_RELEASE_TABLET | Freq: Every day | ORAL | 0 refills | Status: AC

## 2019-04-11 MED ORDER — LISINOPRIL 2.5 MG PO TABS: 2.5000 mg | ORAL_TABLET | Freq: Every day | ORAL | 0 refills | Status: DC

## 2019-04-11 MED ORDER — ISOSORBIDE MONONITRATE ER 30 MG PO TB24: 30.0000 mg | ORAL_TABLET | Freq: Every day | ORAL | 1 refills | Status: AC

## 2019-04-11 MED ORDER — INSULIN PEN NEEDLE 32G X 4 MM MISC: 25.0000 [IU] | Freq: Every morning | 0 refills | Status: AC

## 2019-04-11 MED ORDER — BLOOD GLUCOSE MONITOR KIT: PACK | 0 refills | Status: AC

## 2019-04-11 MED ORDER — ROSUVASTATIN CALCIUM 5 MG PO TABS: 5.0000 mg | ORAL_TABLET | Freq: Every day | ORAL | 1 refills | Status: AC

## 2019-04-11 MED ORDER — INSULIN GLARGINE 100 UNIT/ML ~~LOC~~ SOLN: 25.0000 [IU] | Freq: Every day | SUBCUTANEOUS | Status: DC

## 2019-04-11 NOTE — Progress Notes (Signed)
Progress Note  Patient Name: Matthew Blevins Date of Encounter: 04/11/2019  Primary Cardiologist: Kateri Mcuke  Subjective   He continues to note unchanged dull chest discomfort that is not exacerbated with ambulation. Tolerating all medications without issues.   Inpatient Medications    Scheduled Meds: . aspirin EC  81 mg Oral Daily  . clopidogrel  75 mg Oral Q breakfast  . enoxaparin (LOVENOX) injection  40 mg Subcutaneous Q24H  . ezetimibe  10 mg Oral Daily  . insulin aspart  0-5 Units Subcutaneous QHS  . insulin aspart  0-9 Units Subcutaneous TID WC  . insulin glargine  16 Units Subcutaneous Daily  . isosorbide mononitrate  30 mg Oral Daily  . lisinopril  2.5 mg Oral Daily  . metoprolol tartrate  25 mg Oral BID  . rosuvastatin  5 mg Oral q1800  . sodium chloride flush  3 mL Intravenous Q12H  . sodium chloride flush  3 mL Intravenous Q12H   Continuous Infusions: . sodium chloride     PRN Meds: sodium chloride, acetaminophen, ALPRAZolam, nitroGLYCERIN, ondansetron (ZOFRAN) IV, sodium chloride flush   Vital Signs    Vitals:   04/10/19 1643 04/10/19 1919 04/11/19 0345 04/11/19 0738  BP: 115/76 113/70 113/77 112/74  Pulse: 79 85 73 79  Resp: 19 18 18 19   Temp: 97.9 F (36.6 C) 97.8 F (36.6 C) 97.6 F (36.4 C) 98.5 F (36.9 C)  TempSrc: Oral Oral  Oral  SpO2: 99% 100% 98% 99%  Weight:      Height:        Intake/Output Summary (Last 24 hours) at 04/11/2019 0831 Last data filed at 04/11/2019 0819 Gross per 24 hour  Intake -  Output 800 ml  Net -800 ml   Filed Weights   04/09/19 0933 04/09/19 1255 04/09/19 1436  Weight: 80.7 kg 83.6 kg 83.5 kg    Telemetry    SR - Personally Reviewed  ECG    04/10/2019: NSR, 78 bpm, no acute st/t changes - Personally Reviewed  Physical Exam   GEN: No acute distress.   Neck: No JVD. Cardiac: RRR, no murmurs, rubs, or gallops. Left radial cardiac cath site is well healing without active bleeding, bruising, swelling,  warmth, erythema, or TTP. Radial pulse 2+. Respiratory: Clear to auscultation bilaterally.  GI: Soft, nontender, non-distended.   MS: No edema; No deformity. Neuro:  Alert and oriented x 3; Nonfocal.  Psych: Normal affect.  Labs    Chemistry Recent Labs  Lab 04/09/19 0946 04/10/19 0523 04/10/19 1007  NA 135 135  --   K 4.0 3.5  --   CL 100 105  --   CO2 22 22  --   GLUCOSE 409* 303*  --   BUN 9 10  --   CREATININE 0.63 0.52*  --   CALCIUM 9.4 8.2*  --   PROT  --   --  6.5  ALBUMIN  --   --  3.6  AST  --   --  35  ALT  --   --  48*  ALKPHOS  --   --  66  BILITOT  --   --  1.2  GFRNONAA >60 >60  --   GFRAA >60 >60  --   ANIONGAP 13 8  --      Hematology Recent Labs  Lab 04/09/19 0946 04/10/19 0523  WBC 6.0 6.4  RBC 5.23 4.65  HGB 17.1* 15.2  HCT 46.8 42.9  MCV 89.5 92.3  MCH 32.7 32.7  MCHC 36.5* 35.4  RDW 11.7 11.9  PLT 204 193    Cardiac EnzymesNo results for input(s): TROPONINI in the last 168 hours. No results for input(s): TROPIPOC in the last 168 hours.   BNPNo results for input(s): BNP, PROBNP in the last 168 hours.   DDimer No results for input(s): DDIMER in the last 168 hours.   Radiology    Dg Chest Port 1 View  Result Date: 04/09/2019 IMPRESSION: Negative chest. Electronically Signed   By: Monte Fantasia M.D.   On: 04/09/2019 09:44    Cardiac Studies   LHC 04/09/2019: Conclusions: 1. Severe single-vessel coronary artery disease with subtotal occlusion of OM2 with TIMI-1 flow.  This vessel is small (less than 2 mm in diameter) and is not well-suited for PCI.  There is mild to moderate, non-obstructive coronary artery disease in the remainder of the LCx and RCA. 2. Widely patent ostial/proximal LAD stent. 3. Mid anterior hypokinesis with otherwise preserved left ventricular systolic function (LVEF 45 to 50%). 4. Mildly elevated left ventricular filling pressure.  Recommendations: 1. Medical therapy of NSTEMI, including dual antiplatelet  therapy with aspirin and clopidogrel for 12 months. 2. Discontinue nitroglycerin paste and add isosorbide mononitrate 30 mg daily for antianginal therapy. 3. Continue current dose of metoprolol; consider adding ACE inhibitor or ARB tomorrow, as blood pressure and renal function tolerate. 4. Aggressive secondary prevention, including statin therapy.  If patient is intolerant of low-dose rosuvastatin, PCSK9 inhibitor will need to be considered as an outpatient.  Given marked hypertriglyceridemia, addition of Vascepa for secondary prevention should also be considered. 5. Likely discharge home tomorrow. __________  2D Echo 04/09/2019: 1. The left ventricle has normal systolic function, with an ejection fraction of 55-60%. The cavity size was normal. Left ventricular diastolic Doppler parameters are consistent with impaired relaxation.  2. The right ventricle has normal systolic function. The cavity was normal. There is no increase in right ventricular wall thickness. Normal RVSP.  Patient Profile     53 y.o. male with history of CAD s/p PCI/DES to the proximal LAD in 2017, HFrEF secondary to ICM with prior EF of 45% by echo in 04/2016 with subsequent normalization of EF by echo this admission, DM2, HTN, HLD, alcohol abuse, mood disorder, reported memory difficulty, and back pain who was admitted with a NSTEMI s/p LHC 04/09/2019 as above.   Assessment & Plan    1. CAD s/p PCI to the proximal LAD in 04/2016 admitted with NSTEMI: -He underwent LHC on 04/09/2019 that showed a subtotally occluded OM2 with medical management recommended as above -He continues to note dull 3/10 chest discomfort that is unchanged -He does not want addition of Imdur at this time, if symptoms persist, consider addition of Imdur as an outpatient -He wants to go home -Continue DAPT with ASA and Plavix without interruption for at least the next 12 months -Continue Crestor and metoprolol as below -Aggressive risk factor  modification and secondary prevention -Post cath instructions   2. HFrEF secondary to ICM: -Subsequent normalization of EF by echo this admission -Continue Metoprolol and lisinopril  -He appears euvolemic and well compensated  -CHF education   3. HTN: -Blood pressure well controlled -Continue current therapy   4. HLD: -Reports intolerance to statins -Tolerating low dose Crestor this admission -Remains on Zetia -LDL this admission 124 with goal LDL < 70 -As an outpatient we will need to look into PCSK-9 inhibitor or bempedoic acid  5. Poorly controlled DM: -A1c 11.7  this admission  -Not compliant  -Per IM -Needs close follow up with PCP  6. Medication noncompliance: -Compliance recommended   7. Mildly elevated ALT: -Outpatient follow up   For questions or updates, please contact [facility] Please consult [email or web address] for contact info under Cardiology/STEMI.    Signed, Eula Listenyan Denver Harder, PA-C [facility] Pager: [phone] 04/11/2019, 8:31 AM

## 2019-04-11 NOTE — Progress Notes (Signed)
Discharge instructions explained to pt/ verbalized an understanding/ ivs and tele removed/ work note provided/ will transport off unit when ride arrives

## 2019-04-11 NOTE — Telephone Encounter (Signed)
[  04/11/2019 3:52 PM]  Bumgarner, Lovena Le TCM....  Patient is being discharged   They saw Mickle Plumb  They are scheduled to see Christell Faith on 7/27   They were seen for NSTEMI  They need to be seen within 7-10 days  Pt not placed on wait list   Please call

## 2019-04-11 NOTE — Progress Notes (Addendum)
Inpatient Diabetes Program Recommendations  AACE/ADA: New Consensus Statement on Inpatient Glycemic Control   Target Ranges:  Prepandial:   less than 140 mg/dL      Peak postprandial:   less than 180 mg/dL (1-2 hours)      Critically ill patients:  140 - 180 mg/dL   Results for ISIAC, [name] (MRN [id number]) as of 04/11/2019 09:54  Ref. Range 04/10/2019 08:29 04/10/2019 11:28 04/10/2019 16:45 04/10/2019 20:31 04/11/2019 07:39  Glucose-Capillary Latest Ref Range: 70 - 99 mg/dL 305 (H) 373 (H) 293 (H) 327 (H) 243 (H)  Results for Matthew Blevins, Matthew Blevins (MRN [id number]) as of 04/11/2019 09:54  Ref. Range 04/09/2019 13:07  Hemoglobin A1C Latest Ref Range: 4.8 - 5.6 % 11.7 (H)   Review of Glycemic Control  Diabetes history: DM2 Outpatient Diabetes medications: Basaglar 20 units daily as needed, Metformin 1000 mg BID (not taking Basaglar and Metformin) Current orders for Inpatient glycemic control: Lantus 16 units daily, Novolog 0-9 units TID with meals, Novolog 0-5 units QHS  Inpatient Diabetes Program Recommendations:   Insulin - Basal: Please consider increasing Lantus to 25 units daily. If MD agreeable, please also order one time Lantus 9 units x1 now (since patient has already received Lantus 16 units this morning).  HgbA1C: A1C 11.7% on 04/09/19 indicating an average glucose of 289 mg/dl over the past 2-3 months.  At time of discharge, please provide Rx for: glucose monitoring kit (# [id number]), [name] [phone]), and insulin pen needles [phone]).  Thanks, Barnie Alderman, RN, MSN, CDE Diabetes Coordinator Inpatient Diabetes Program [phone] (Team Pager from 8am to 5pm)

## 2019-04-11 NOTE — Discharge Summary (Signed)
[facility] [place] NAME: Matthew Blevins    MR#:  [id number]  DATE OF BIRTH:  1966-01-27  DATE OF ADMISSION:  04/09/2019 ADMITTING PHYSICIAN: Dustin Flock, MD  DATE OF DISCHARGE:  04/11/19  PRIMARY CARE PHYSICIAN: [place]    ADMISSION DIAGNOSIS:  NSTEMI (non-ST elevated myocardial infarction) ([place]) [I21.4]  DISCHARGE DIAGNOSIS:  Active Problems:   NSTEMI (non-ST elevated myocardial infarction) ([place])   Uncontrolled type 2 diabetes mellitus with hyperglycemia ([place])   Uncontrolled hypertension   SECONDARY DIAGNOSIS:   Past Medical History:  Diagnosis Date  . Anxiety   . CAD (coronary artery disease)    stent to lad 2018  . Diabetes mellitus without complication ([place])   . ED (erectile dysfunction)   . GERD (gastroesophageal reflux disease)   . High cholesterol   . Hypertension   . Lumbar herniated disc     HOSPITAL COURSE:   HISTORY OF PRESENT ILLNESS: Matthew Blevins  is a 53 y.o. male with a known history of coronary artery disease with previous stent to the LAD in 2018 at [facility].  Patient states that he was taking his medications as scheduled for year after his stent.  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  And also other medications were not making him feel good.  So he stopped taking all his medications.  He also states that for his diabetes he was just using as needed insulin.  He has not been able to get back to see his primary care providers.  Resents with complaint of sharp chest pain which she describes as stabbing type of pain last night.  He went to work today and continued to have symptoms.  He had chest pain along with that he got short of breath.  In the ER patient was noted to have elevated troponin with no EKG changes suggestive of non-ST MI.  He denies any nausea or vomiting or radiation of the pain to his neck jaw or arm.  He does complain of pain  radiating up to his chest.    1.  Non-STEMI Mild elevated cardiac enzymes which are not trending Initially started on heparin drip which is discontinued, pt is asymptomatic Status post cardiac cath-subtotally occluded obtuse marginal branch #2.  No further cardiac interventions are needed medical management is advised Continue aspirin, Plavix.  Started on beta-blocker metoprolol, lisinopril Patient states that he could not tolerate statins but no specific allergy. We will treat with low-dose Crestor, Zetia Cardiology is considering to arrange PCSK9 inhibitor outpatient.  Plan of care discussed with Dr. Rockey Situ  2.Diabetes type 2 with blood sugars elevated noncompliant with medications Hemoglobin A1c 11.7 Reinforced the importance of being compliant with his medications patient is agreeable to take his insulin, d/w wife over phone , she will supervise him n make sure pt takes his meds including insulin Diabetic coordinator has followed , DM education provided Outpatient follow-up with endocrinology patient is agreeable   3.Hyperlipidemia with hypertriglyceridemia-triglycerides at 673 LDL cannot be calculated Low-dose statin and Zetia.  Outpatient follow-up with cardiology recommending Vascepa as an outpatient  4.Hypertension -on metoprolol will adjust his medications as needed  5.Heparin for DVT prophylaxis DISCHARGE CONDITIONS:   fair  CONSULTS OBTAINED:  Treatment Team:  Nelva Bush, MD   PROCEDURES   DRUG ALLERGIES:   Allergies  Allergen Reactions  . Statins Other (See Comments)    Joint and muscle pain  DISCHARGE MEDICATIONS:   Allergies as of 04/11/2019      Reactions   Statins Other (See Comments)   Joint and muscle pain      Medication List    TAKE these medications   aspirin 81 MG EC tablet Take 1 tablet (81 mg total) by mouth daily. Start taking on: April 12, 2019   Basaglar KwikPen 100 UNIT/ML Sopn Inject 0.25 mLs (25 Units  total) into the skin daily. What changed:   how much to take  when to take this  reasons to take this   blood glucose meter kit and supplies Kit Dispense based on patient and insurance preference. Use up to four times daily as directed. (FOR ICD-9 250.00, 250.01).   citalopram 10 MG tablet Commonly known as: CELEXA Take 1 tablet (10 mg total) by mouth daily.   clopidogrel 75 MG tablet Commonly known as: PLAVIX Take 1 tablet (75 mg total) by mouth daily with breakfast. Start taking on: April 12, 2019   ezetimibe 10 MG tablet Commonly known as: ZETIA Take 1 tablet (10 mg total) by mouth daily.   Insulin Pen Needle 32G X 4 MM Misc 25 Units by Does not apply route every morning.   isosorbide mononitrate 30 MG 24 hr tablet Commonly known as: IMDUR Take 1 tablet (30 mg total) by mouth daily. Start taking on: April 12, 2019   lisinopril 2.5 MG tablet Commonly known as: ZESTRIL Take 1 tablet (2.5 mg total) by mouth daily. Start taking on: April 12, 2019 What changed:   medication strength  how much to take   metFORMIN 500 MG tablet Commonly known as: GLUCOPHAGE Take 1,000 mg by mouth 2 (two) times daily with a meal.   metoprolol tartrate 25 MG tablet Commonly known as: LOPRESSOR Take 1 tablet (25 mg total) by mouth 2 (two) times daily.   nitroGLYCERIN 0.4 MG SL tablet Commonly known as: NITROSTAT Place 1 tablet (0.4 mg total) under the tongue every 5 (five) minutes x 3 doses as needed for chest pain.   rosuvastatin 5 MG tablet Commonly known as: CRESTOR Take 1 tablet (5 mg total) by mouth daily at 6 PM.        DISCHARGE INSTRUCTIONS:   Follow-up with primary care physician in 2 to 3 days Follow-up with cardiology in a week Follow-up with endocrinology in 7 to 10 days  DIET:  Cardiac, diabetic diet  DISCHARGE CONDITION:  Stable  ACTIVITY:  Activity as tolerated  OXYGEN:  Home Oxygen: No.   Oxygen Delivery: room air  DISCHARGE LOCATION:  home    If you experience worsening of your admission symptoms, develop shortness of breath, life threatening emergency, suicidal or homicidal thoughts you must seek medical attention immediately by calling 911 or calling your MD immediately  if symptoms less severe.  You Must read complete instructions/literature along with all the possible adverse reactions/side effects for all the Medicines you take and that have been prescribed to you. Take any new Medicines after you have completely understood and accpet all the possible adverse reactions/side effects.   Please note  You were cared for by a hospitalist during your hospital stay. If you have any questions about your discharge medications or the care you received while you were in the hospital after you are discharged, you can call the unit and asked to speak with the hospitalist on call if the hospitalist that took care of you is not available. Once you are discharged, your primary care physician will  handle any further medical issues. Please note that NO REFILLS for any discharge medications will be authorized once you are discharged, as it is imperative that you return to your primary care physician (or establish a relationship with a primary care physician if you do not have one) for your aftercare needs so that they can reassess your need for medications and monitor your lab values.     Today  Chief Complaint  Patient presents with  . Chest Pain   Pt is doing fine , understood that he needs to be compliant with his meds  ROS:  CONSTITUTIONAL: Denies fevers, chills. Denies any fatigue, weakness.  EYES: Denies blurry vision, double vision, eye pain. EARS, NOSE, THROAT: Denies tinnitus, ear pain, hearing loss. RESPIRATORY: Denies cough, wheeze, shortness of breath.  CARDIOVASCULAR: Denies chest pain, palpitations, edema.  GASTROINTESTINAL: Denies nausea, vomiting, diarrhea, abdominal pain. Denies bright red blood per rectum. GENITOURINARY:  Denies dysuria, hematuria. ENDOCRINE: Denies nocturia or thyroid problems. HEMATOLOGIC AND LYMPHATIC: Denies easy bruising or bleeding. SKIN: Denies rash or lesion. MUSCULOSKELETAL: Denies pain in neck, back, shoulder, knees, hips or arthritic symptoms.  NEUROLOGIC: Denies paralysis, paresthesias.  PSYCHIATRIC: Denies anxiety or depressive symptoms.   VITAL SIGNS:  Blood pressure 112/74, pulse 79, temperature 98.5 F (36.9 C), temperature source Oral, resp. rate 19, height '5\' 10"'  (1.778 m), weight 83.5 kg, SpO2 99 %.  I/O:    Intake/Output Summary (Last 24 hours) at 04/11/2019 1053 Last data filed at 04/11/2019 0819 Gross per 24 hour  Intake -  Output 800 ml  Net -800 ml    PHYSICAL EXAMINATION:  GENERAL:  53 y.o.-year-old patient lying in the bed with no acute distress.  EYES: Pupils equal, round, reactive to light and accommodation. No scleral icterus. Extraocular muscles intact.  HEENT: Head atraumatic, normocephalic. Oropharynx and nasopharynx clear.  NECK:  Supple, no jugular venous distention. No thyroid enlargement, no tenderness.  LUNGS: Normal breath sounds bilaterally, no wheezing, rales,rhonchi or crepitation. No use of accessory muscles of respiration.  CARDIOVASCULAR: S1, S2 normal. No murmurs, rubs, or gallops.  ABDOMEN: Soft, non-tender, non-distended. Bowel sounds present. No organomegaly or mass.  EXTREMITIES: No pedal edema, cyanosis, or clubbing.  NEUROLOGIC: Cranial nerves II through XII are intact. Muscle strength 5/5 in all extremities. Sensation intact. Gait not checked.  PSYCHIATRIC: The patient is alert and oriented x 3.  SKIN: No obvious rash, lesion, or ulcer.   DATA REVIEW:   CBC Recent Labs  Lab 04/10/19 0523  WBC 6.4  HGB 15.2  HCT 42.9  PLT 193    Chemistries  Recent Labs  Lab 04/10/19 0523 04/10/19 1007  NA 135  --   K 3.5  --   CL 105  --   CO2 22  --   GLUCOSE 303*  --   BUN 10  --   CREATININE 0.52*  --   CALCIUM 8.2*  --    AST  --  35  ALT  --  48*  ALKPHOS  --  66  BILITOT  --  1.2    Cardiac Enzymes No results for input(s): TROPONINI in the last 168 hours.  Microbiology Results  Results for orders placed or performed during the hospital encounter of 04/09/19  SARS Coronavirus 2 (CEPHEID - Performed in [facility] hospital lab), Hosp Order     Status: None   Collection Time: 04/09/19 11:06 AM   Specimen: Nasopharyngeal Swab  Result Value Ref Range Status   SARS Coronavirus 2 NEGATIVE NEGATIVE  Final    Comment: (NOTE) If result is NEGATIVE SARS-CoV-2 target nucleic acids are NOT DETECTED. The SARS-CoV-2 RNA is generally detectable in upper and lower  respiratory specimens during the acute phase of infection. The lowest  concentration of SARS-CoV-2 viral copies this assay can detect is 250  copies / mL. A negative result does not preclude SARS-CoV-2 infection  and should not be used as the sole basis for treatment or other  patient management decisions.  A negative result may occur with  improper specimen collection / handling, submission of specimen other  than nasopharyngeal swab, presence of viral mutation(s) within the  areas targeted by this assay, and inadequate number of viral copies  (<250 copies / mL). A negative result must be combined with clinical  observations, patient history, and epidemiological information. If result is POSITIVE SARS-CoV-2 target nucleic acids are DETECTED. The SARS-CoV-2 RNA is generally detectable in upper and lower  respiratory specimens dur ing the acute phase of infection.  Positive  results are indicative of active infection with SARS-CoV-2.  Clinical  correlation with patient history and other diagnostic information is  necessary to determine patient infection status.  Positive results do  not rule out bacterial infection or co-infection with other viruses. If result is PRESUMPTIVE POSTIVE SARS-CoV-2 nucleic acids MAY BE PRESENT.   A presumptive positive  result was obtained on the submitted specimen  and confirmed on repeat testing.  While 2019 novel coronavirus  (SARS-CoV-2) nucleic acids may be present in the submitted sample  additional confirmatory testing may be necessary for epidemiological  and / or clinical management purposes  to differentiate between  SARS-CoV-2 and other Sarbecovirus currently known to infect humans.  If clinically indicated additional testing with an alternate test  methodology [phone]) is advised. The SARS-CoV-2 RNA is generally  detectable in upper and lower respiratory sp ecimens during the acute  phase of infection. The expected result is Negative. Fact Sheet for Patients:  StrictlyIdeas.no Fact Sheet for Healthcare Providers: BankingDealers.co.za This test is not yet approved or cleared by the [place] FDA and has been authorized for detection and/or diagnosis of SARS-CoV-2 by FDA under an Emergency Use Authorization (EUA).  This EUA will remain in effect (meaning this test can be used) for the duration of the COVID-19 declaration under Section 564(b)(1) of the Act, 21 U.S.C. section 360bbb-3(b)(1), unless the authorization is terminated or revoked sooner. Performed at [facility], [address] [place], [place] [address]     RADIOLOGY:  Dg Chest Port 1 View  Result Date: 04/09/2019 CLINICAL DATA:  Chest pain EXAM: PORTABLE CHEST 1 VIEW COMPARISON:  12/20/2015 FINDINGS: Normal heart size and mediastinal contours. No acute infiltrate or edema. No effusion or pneumothorax. No acute osseous findings. IMPRESSION: Negative chest. Electronically Signed   By: Monte Fantasia M.D.   On: 04/09/2019 09:44    EKG:   Orders placed or performed during the hospital encounter of 04/09/19  . ED EKG  . ED EKG  . EKG 12-Lead  . EKG 12-Lead  . EKG 12-Lead  . EKG 12-Lead  . EKG 12-Lead  . EKG 12-Lead  . EKG 12-Lead      Management plans  discussed with the patient, family and they are in agreement.  CODE STATUS:     Code Status Orders  (From admission, onward)         Start     Ordered   04/09/19 1251  Full code  Continuous  04/09/19 1250        Code Status History    This patient has a current code status but no historical code status.   Advance Care Planning Activity      TOTAL TIME TAKING CARE OF THIS PATIENT: 50  minutes.   Note: This dictation was prepared with Dragon dictation along with smaller phrase technology. Any transcriptional errors that result from this process are unintentional.  Greater than 50% was spent in counseling and coordination of care with patient '@[email or web address]' @  on 04/11/2019 at 10:53 AM  Between 7am to 6pm - Pager - [phone]  After 6pm go to [email or web address] - password [email or web address] [facility]  Office  [phone]  CC: Primary care physician; [place]

## 2019-04-16 NOTE — Progress Notes (Signed)
Office Visit    Patient Name: Matthew Blevins Date of Encounter: 04/22/2019  Primary Care Provider:  [facility] Primary Cardiologist:  Ida Rogue, MD  Chief Complaint    53 yo male with PMH CAD presents for hospital follow up after NSTEMI.   Past Medical History    Past Medical History:  Diagnosis Date   Anxiety    CAD (coronary artery disease)    a. Stent LAD 2018 b. 04/09/19 severe single vessel subtotal occlusion OM2 no suitable for PCI, patent LAD stent, mild-mod nonobstructive LCx, RCA   Diabetes mellitus without complication (HCC)    ED (erectile dysfunction)    GERD (gastroesophageal reflux disease)    Hyperlipidemia LDL goal <70    Hypertension    Insomnia    Lumbar herniated disc    L3 herniated disc 2015   Mood disorder ([place])    Past Surgical History:  Procedure Laterality Date   LEFT HEART CATH AND CORONARY ANGIOGRAPHY N/A 04/09/2019   Procedure: LEFT HEART CATH AND CORONARY ANGIOGRAPHY;  Surgeon: Nelva Bush, MD;  Location: [facility];  Service: Cardiovascular;  Laterality: N/A;   SHOULDER SURGERY Left 2001   tissue removed from "hood" of shoulder    Allergies  Allergies  Allergen Reactions   Statins Other (See Comments)    Joint and muscle pain    History of Present Illness    Matthew Blevins is  53 yo male with history of CAD (s/p PCI/DES to prox LAD in 2017, NSTEMI 03/2019), HFrEF secondary to ICM, DM2, HTN, HLD, alcohol abuse, mood disorder, herniated disc, ED and GERD. Presents today for follow up after recent admission to [facility] for NSTEMI from 04/09/19-04/11/19.   Previously followed by [facility] with NSTEMI 2018 with LHC showing 95% proximal LAD stenosis s/p PCI/DES. Echo at that time with EF 45%, hypohypokinesis of the anterior, lateral and apical wall, mild LVH, normal RVSP, trivial MR/TR. After this event, at unknown date, he self-discontinued medications, but did report completing 12  months of Brilinta. Reports he was followed by outside cardiologist, records unavailable for review. Admitted to [facility] 04/09/19 with chest pain, SOB waking from sleep the night prior. Initial hs-Tn of 134 with delta troponin 163. EKG with no significant abnormality. LHC 04/09/19 severe single vessel CAD with subtotal occlusion of OM2. Vessel was small in size, less tham 54m diameter, not well suite for PCI. Mild to moderate, nonobstructive CAD in remainder of LCx and RCA. Prior ostial/prox LAC stent widely patent. Mid anterior hypokinesis with otherwise preserved LV systolic function, EF 456-38% Mildly elevated LV filling pressures. DAPT recommended for 12 months, started on Imdur. Echo 04/09/19 with EF 55-60%, normal LV cavity size, diastolic dysfunction, normal RVSF, normal RVSP, no significant valvular abnormalities. He was discharged 04/11/19 on aspirin, Plavix, Zetia, low-dose crestor.  Labs: direct LDL 124, TG 673, TC 271, AST normal, ALT 48, albumin 3.6, potassium 3.5, SCr 0.52, HGB 15.2, PLT 193, A1c 11.7, TSH normal  Discharge weight: 83.5 kg  Presents today with his wife. He reports feeling decent since discharge. Has had some "chest discomfort" under his L ribcage noted at rest associated with no SOB, no diaphoresis. Every couple days will have a "sharp" pain at rest under his L ribcage at rest which self resolves after a few seconds. No chest pain with activity. Has not taken nitroglycerin for these episodes. He has been taking his Imdur 335mdaily. Feels things have been improving since leaving the  hospital.   He is checking his BP intermittently with systolic readings 720N-470J.   Most bothered by fatigue. Reports he can't fall asleep at night, his PCP recommended Melatonin, but he wanted to check with [place] before starting. Encouraged him to establish a night time routine and that melatonin would be fine from a cardiac perspective.   Has been monitoring his blood sugar carefully, reports his  fasting blood sugars have been <200. Tolerating his medications. Does note some GI upset intermittently which he attributes to Metformin. He is trying to follow a heart healthy, low salt, low carbohydrate diet. Tolerating low dose Crestor and Zetia. He has not established an exercise regimen. We discussed cardiac rehab and he politely declines.   He denies LE edema, palpitations, SOB.   EKGs/Labs/Other Studies Reviewed:   The following studies were reviewed today: LHC Apr 19, 2019 Conclusions: 1. Severe single-vessel coronary artery disease with subtotal occlusion of OM2 with TIMI-1 flow.  This vessel is small (less than 2 mm in diameter) and is not well-suited for PCI.  There is mild to moderate, non-obstructive coronary artery disease in the remainder of the LCx and RCA. 2. Widely patent ostial/proximal LAD stent. 3. Mid anterior hypokinesis with otherwise preserved left ventricular systolic function (LVEF 45 to 50%). 4. Mildly elevated left ventricular filling pressure.   Recommendations: 1. Medical therapy of NSTEMI, including dual antiplatelet therapy with aspirin and clopidogrel for 12 months. 2. Discontinue nitroglycerin paste and add isosorbide mononitrate 30 mg daily for antianginal therapy. 3. Continue current dose of metoprolol; consider adding ACE inhibitor or ARB tomorrow, as blood pressure and renal function tolerate. 4. Aggressive secondary prevention, including statin therapy.  If patient is intolerant of low-dose rosuvastatin, PCSK9 inhibitor will need to be considered as an outpatient.  Given marked hypertriglyceridemia, addition of Vascepa for secondary prevention should also be considered. 5. Likely discharge home tomorrow.  Echo 2019/04/19 IMPRESSIONS  1. The left ventricle has normal systolic function, with an ejection fraction of 55-60%. The cavity size was normal. Left ventricular diastolic Doppler parameters are consistent with impaired relaxation.  2. The right ventricle has  normal systolic function. The cavity was normal. There is no increase in right ventricular wall thickness. Normal RVSP   EKG:  EKG is ordered today.  The ekg ordered today demonstrates normal sinus rhythm rate 75 with no acute ST/T wave changes.   Recent Labs: April 19, 2019: TSH 1.446 04/10/2019: ALT 48; BUN 10; Creatinine, Ser 0.52; Hemoglobin 15.2; Platelets 193; Potassium 3.5; Sodium 135   Recent Lipid Panel    Component Value Date/Time   CHOL 271 (H) 04/10/2019 0523   TRIG 673 (H) 04/10/2019 0523   HDL 28 (L) 04/10/2019 0523   CHOLHDL 9.7 04/10/2019 0523   VLDL UNABLE TO CALCULATE IF TRIGLYCERIDE OVER 400 mg/dL 04/10/2019 0523   LDLCALC UNABLE TO CALCULATE IF TRIGLYCERIDE OVER 400 mg/dL 04/10/2019 0523   LDLDIRECT 124.6 (H) 04/10/2019 0523    Home Medications   Prior to Admission medications   Medication Sig Start Date End Date Taking? Authorizing Provider  aspirin EC 81 MG EC tablet Take 1 tablet (81 mg total) by mouth daily. 04/12/19   Nicholes Mango, MD  blood glucose meter kit and supplies KIT Dispense based on patient and insurance preference. Use up to four times daily as directed. (FOR ICD-9 250.00, 250.01). 04/11/19   Gouru, Aruna, MD  citalopram (CELEXA) 10 MG tablet Take 1 tablet (10 mg total) by mouth daily. 04/11/19   Nicholes Mango, MD  clopidogrel (PLAVIX)  75 MG tablet Take 1 tablet (75 mg total) by mouth daily with breakfast. 04/12/19   Gouru, Illene Silver, MD  ezetimibe (ZETIA) 10 MG tablet Take 1 tablet (10 mg total) by mouth daily. 04/11/19   Nicholes Mango, MD  Insulin Glargine (BASAGLAR KWIKPEN) 100 UNIT/ML SOPN Inject 0.25 mLs (25 Units total) into the skin daily. 04/11/19   Gouru, Illene Silver, MD  Insulin Pen Needle 32G X 4 MM MISC 25 Units by Does not apply route every morning. 04/11/19   Nicholes Mango, MD  isosorbide mononitrate (IMDUR) 30 MG 24 hr tablet Take 1 tablet (30 mg total) by mouth daily. 04/12/19   Gouru, Illene Silver, MD  lisinopril (ZESTRIL) 2.5 MG tablet Take 1 tablet (2.5 mg  total) by mouth daily. 04/12/19   Nicholes Mango, MD  metFORMIN (GLUCOPHAGE) 500 MG tablet Take 1,000 mg by mouth 2 (two) times daily with a meal.    [provider]  metoprolol tartrate (LOPRESSOR) 25 MG tablet Take 1 tablet (25 mg total) by mouth 2 (two) times daily. 04/11/19   Gouru, Illene Silver, MD  nitroGLYCERIN (NITROSTAT) 0.4 MG SL tablet Place 1 tablet (0.4 mg total) under the tongue every 5 (five) minutes x 3 doses as needed for chest pain. 04/11/19   Nicholes Mango, MD  rosuvastatin (CRESTOR) 5 MG tablet Take 1 tablet (5 mg total) by mouth daily at 6 PM. 04/11/19   Nicholes Mango, MD     Review of Systems    Review of Systems  Constitution: Positive for malaise/fatigue. Negative for chills and fever.  Cardiovascular: Negative for dyspnea on exertion, irregular heartbeat, leg swelling, palpitations and paroxysmal nocturnal dyspnea.       "some chest discomfort"  Respiratory: Negative for cough, shortness of breath and wheezing.   Gastrointestinal: Negative for nausea and vomiting.  Neurological: Negative for dizziness, light-headedness and weakness.   .All other systems reviewed and are otherwise negative except as noted above.  Physical Exam    VS:  BP 110/80 (BP Location: Left Arm, Patient Position: Sitting, Cuff Size: Normal)    Pulse 75    Temp 97.6 F (36.4 C)    Ht _0  (1.778 m)    Wt 185 lb 12 oz (84.3 kg)    SpO2 99%    BMI 26.65 kg/m  , BMI Body mass index is 26.65 kg/m. GEN: Well nourished, well developed, in no acute distress. HEENT: normal. Neck: Supple, no JVD, carotid bruits, or masses. Cardiac: RRR, no murmurs, rubs, or gallops. No clubbing, cyanosis, edema.  Radials/DP/PT 2+ and equal bilaterally.  Respiratory:  Respirations regular and unlabored, clear to auscultation bilaterally. GI: Soft, nontender, nondistended. MS: no deformity or atrophy. Skin: Warm and dry, no rash. Yellowed area of ecchymosis to L AC from IV in hospital, healing appropriately.  Neuro:   Strength and sensation are intact. Psych: Normal affect.  Accessory Clinical Findings    ECG personally reviewed by me today - NSR rate 75 - no acute changes.  Assessment & Plan    1.  CAD - Stable. Admitted 04/09/19-04/11/19 with NSTEMI. Subtotal occluded obtuse marginal branch #2 not suited for intervention, medical management recommended. Patent stent ostial/prox LAD placed 2018. GDMT asa, statin, beta blocker, PRN nitroglycerin. DAPT recommended for 12 months (stop date 03/2020), continue Aspirin and Plavix. He has some atypical anginal symptoms with "chest discomfort" below his L ribcage, no use of nitroglycerin. No symptoms with activity, no SOB/DOE. He feels his symptoms are improving since leaving the hospital. Discussion with him and  his wife on nitroglycerin use. Given known occlusion to OM2 - consider increasing Imdur dose at next office visit. Defer at this time due to low normal BP.   Politely declines cardiac rehab. Encouraged gradual increase of exercise. Encourage low salt, heart healthy diet.  Cut Metoprolol dose in half secondary to fatigue.   2. HTN - Checking BP intermittently at home, systolic BP 505X-833P. BP low normal today 110/80.   Discontinue Lisinopril 2.43m given low normal BP and normal EF 55-60% on recent echo 04/09/19.  3. HLD with LDL goal <70 - Direct LDL 124.6 on 04/10/19. Discharged from hospital on Zetia, low dose Crestor. Tolerating wel. Consider PCSK9 vs bempedoic acid after recheck of lipids. Concern for compliance/cost of additional agents.   Lipid profile and LFTs at follow up in 1 month.   4. DM2 - Follows with PCP. A1c 11.7 on recent admission. Continue home monitoring of blood sugar. If additional agent needed, consider SGLT2 such as Jardiance for cardioprotective benefit.   Disposition: Follow up in 1 months with APP or Dr. GRockey Situ    CLoel Dubonnet NP 04/22/2019, 10:25 AM

## 2019-04-17 NOTE — Telephone Encounter (Signed)
No answer. Left message to call back.   

## 2019-04-19 NOTE — Telephone Encounter (Signed)
Attempted to call the patient to for TCM & COVID screening for his appt on Monday 7/27 @ 9:30 am with Ignacia Bayley, NP.

## 2019-04-22 ENCOUNTER — Ambulatory Visit (INDEPENDENT_AMBULATORY_CARE_PROVIDER_SITE_OTHER): Payer: Commercial Managed Care - PPO | Admitting: Family

## 2019-04-22 ENCOUNTER — Other Ambulatory Visit: Payer: Self-pay

## 2019-04-22 ENCOUNTER — Telehealth: Payer: Self-pay | Admitting: Cardiovascular Disease

## 2019-04-22 ENCOUNTER — Encounter: Payer: Self-pay | Admitting: Physician Assistant

## 2019-04-22 ENCOUNTER — Encounter (INDEPENDENT_AMBULATORY_CARE_PROVIDER_SITE_OTHER): Payer: Self-pay

## 2019-04-22 VITALS — BP 110/80 | HR 75 | Temp 97.6°F | Ht 70.0 in | Wt 185.8 lb

## 2019-04-22 DIAGNOSIS — E1165 Type 2 diabetes mellitus with hyperglycemia: Secondary | ICD-10-CM

## 2019-04-22 DIAGNOSIS — K219 Gastro-esophageal reflux disease without esophagitis: Secondary | ICD-10-CM | POA: Insufficient documentation

## 2019-04-22 DIAGNOSIS — I25118 Atherosclerotic heart disease of native coronary artery with other forms of angina pectoris: Secondary | ICD-10-CM

## 2019-04-22 DIAGNOSIS — E782 Mixed hyperlipidemia: Secondary | ICD-10-CM | POA: Diagnosis not present

## 2019-04-22 DIAGNOSIS — E785 Hyperlipidemia, unspecified: Secondary | ICD-10-CM | POA: Insufficient documentation

## 2019-04-22 DIAGNOSIS — E119 Type 2 diabetes mellitus without complications: Secondary | ICD-10-CM | POA: Insufficient documentation

## 2019-04-22 DIAGNOSIS — Z794 Long term (current) use of insulin: Secondary | ICD-10-CM

## 2019-04-22 DIAGNOSIS — I10 Essential (primary) hypertension: Secondary | ICD-10-CM | POA: Diagnosis not present

## 2019-04-22 DIAGNOSIS — I2510 Atherosclerotic heart disease of native coronary artery without angina pectoris: Secondary | ICD-10-CM | POA: Insufficient documentation

## 2019-04-22 MED ORDER — METOPROLOL TARTRATE 25 MG PO TABS: 12.5000 mg | ORAL_TABLET | Freq: Two times a day (BID) | ORAL | 11 refills | Status: AC

## 2019-04-22 MED ORDER — CLOPIDOGREL BISULFATE 75 MG PO TABS: 75.0000 mg | ORAL_TABLET | Freq: Every day | ORAL | 2 refills | Status: DC

## 2019-04-22 NOTE — Telephone Encounter (Signed)
Received records request [facility] forwarded to [facility] for processing. Also faxed and scanned to [facility]

## 2019-04-22 NOTE — Patient Instructions (Signed)
Medication Instructions:  Your physician has recommended you make the following change in your medication:  1- STOP Lisinopril 2- DECREASE Metoprolol to 0.5 tablet (12.5 mg total) twice daily   If you need a refill on your cardiac medications before your next appointment, please call your pharmacy.   Lab work: None ordered  If you have labs (blood work) drawn today and your tests are completely normal, you will receive your results only by: Marland Kitchen MyChart Message (if you have MyChart) OR . A paper copy in the mail If you have any lab test that is abnormal or we need to change your treatment, we will call you to review the results.  Testing/Procedures: None ordered   Follow-Up: At [facility], you and your health needs are our priority.  As part of our continuing mission to provide you with exceptional heart care, we have created designated Provider Care Teams.  These Care Teams include your primary Cardiologist (physician) and Advanced Practice Providers (APPs -  Physician Assistants and Nurse Practitioners) who all work together to provide you with the care you need, when you need it. You will need a follow up appointment in 1 months.  You may see Ida Rogue, MD or Christell Faith, PA-C.

## 2019-04-23 NOTE — Telephone Encounter (Signed)
Patient has already had appointment yesterday 04/23/19. Closing encounter.

## 2019-05-03 ENCOUNTER — Telehealth: Payer: Self-pay | Admitting: Cardiovascular Disease

## 2019-05-03 MED ORDER — TICAGRELOR 90 MG PO TABS: 90.0000 mg | ORAL_TABLET | Freq: Two times a day (BID) | ORAL | 6 refills | Status: DC

## 2019-05-03 NOTE — Telephone Encounter (Signed)
Returned call to patient with POC from Dr. Fletcher Anon. Pt happy with POC.  Rx for Brilinta sent to pt preferred pharmacy.   Plavix added to ax list.   Advised pt to call for any further questions or concerns.

## 2019-05-03 NOTE — Telephone Encounter (Signed)
Switch to Brilinta 90 mg twice daily.  It appears that he is allergic to Plavix.  Please add that to his allergy list.

## 2019-05-03 NOTE — Telephone Encounter (Signed)
Returned call to patient.   Pt has NSTEMI on 7/14, started on plavix 7/17. Reports 1 week of severe itching in palms on hands and bottom of feet. Saw PCP and she felt it may be r/t plavix and was instructed to call our office. He is currently taking benadryl every 4 hours for itching.   Pt said he was on Brilinta in the past and had no issues. Pt inquiring whether he could be placed back on it in place or plavix.   Routing to provider to further advise.

## 2019-05-03 NOTE — Telephone Encounter (Signed)
Pt c/o medication issue:  1. Name of Medication: Plavix  2. How are you currently taking this medication (dosage and times per day)? 75 mg daily  3. Are you having a reaction (difficulty breathing--STAT)?ithcing on palms and bottom of feet (taking benadryl every 4 hours)  4. What is your medication issue? Possible reaction

## 2019-05-03 NOTE — Telephone Encounter (Signed)
Attempted to call patient. LMTCB 05/03/2019   

## 2019-05-09 NOTE — Telephone Encounter (Signed)
Forms placed in nurse box for review

## 2019-05-10 ENCOUNTER — Telehealth: Payer: Self-pay | Admitting: Cardiovascular Disease

## 2019-05-10 NOTE — Telephone Encounter (Signed)
Forms retrieved from nurse bin from [employer].  Red folder placed on Dr. Gollan's desk for completion.  

## 2019-05-14 NOTE — Telephone Encounter (Signed)
[employer] forms on providers desk

## 2019-05-15 NOTE — Telephone Encounter (Signed)
They are on Dr. Donivan Scull desk for review. Will make him aware and I will be there tomorrow and can check for the forms as well.

## 2019-05-15 NOTE — Telephone Encounter (Signed)
[employer] calling to check on status of forms for patient. They were made aware forms were placed on providers desk on 8/18 and they will let the patient know.

## 2019-05-15 NOTE — Telephone Encounter (Signed)
[employer] calling back [place] spoke with patient - patient will need forms completed and faxed to [facility] by 8/25 or he will lose job

## 2019-05-16 NOTE — Telephone Encounter (Signed)
[employer] forms completed and placed up front on Sabrina's desk for her review. This is a time sensitive document.

## 2019-05-17 NOTE — Telephone Encounter (Signed)
Sent completed forms to [employer] via interoffice. 

## 2019-05-26 NOTE — Progress Notes (Deleted)
Cardiology Office Note    Date:  05/26/2019   ID:  [name], [name] 06/25/1966, MRN [id number]  PCP:  [facility]  Cardiologist:  Julien Nordmann, MD  Electrophysiologist:  None   Chief Complaint: Follow up  History of Present Illness:   Matthew Blevins is a 53 y.o. male with history of CAD status post stenting to the LAD in 2018 with recent non-STEMI in 03/2019 status post PCI as outlined below, HFrEF secondary to ICM with subsequent normalization of EF, DM2, HTN, HLD/hypertriglyceridemia, alcohol abuse, lumbar back pain, mood disorder, anxiety, insomnia, and GERD who presents for follow-up of his CAD.  Patient was previously followed by [facility] with a non-STEMI in 2018 with cardiac cath showing 95% stenosis of the LAD status post PCI/DES.  Echo at that time showed an EF of 45%, anterior, lateral, and apical wall hypokinesis, mild LVH, normal RVSP, trivial MR/TR.  Following this, at an uncertain date, he self discontinued medications though did report completing 12 months of Brilinta.  He has reported he was followed by outside cardiologist though records are unable for review.  He was most recently admitted to the hospital in 03/2019 with a non-STEMI with high-sensitivity troponin peaking at 163.  LHC on 04/09/2019 showed severe single-vessel CAD with subtotal occlusion of OM 2.  The vessel was small in size, less than 2 mm in diameter, and not well suited for PCI.  Otherwise, he was noted to have mild to moderate nonobstructive CAD in the remainder of the LCx and RCA.  Prior ostial/proximal LAD stent was widely patent.  Mid anterior wall hypokinesis with EF of 45 to 50%, and mildly elevated LV filling pressures.  Echo on 04/09/2019 showed an EF of 55 to 60%, normal LV cavity size, diastolic dysfunction, normal RV SF, normal RVSP, no significant valvular abnormalities.  He was seen in hospital follow-up on 04/22/2019 and reported he was doing reasonably well.  He did  notice some chest sharp chest discomfort under the left side of his rib cage at rest that spontaneously resolved within a few seconds.  His main complaint was fatigue and difficulty falling asleep at night.  His PCP recommended he try melatonin.  He was compliant with cardiac meds.  Given his fatigue, metoprolol was decreased, and given relative hypotension his lisinopril was discontinued.  ***   Labs: 03/2019 -albumin 3.6, AST normal, ALT 48, total cholesterol 271, triglyceride 673, HDL 28, direct LDL 124, potassium 3.5, serum creatinine 0.52, WBC 6.4, Hgb 15.2, PLT 193 A1c 11.7, TSH normal    Past Medical History:  Diagnosis Date  . Anxiety   . CAD (coronary artery disease)    a. Stent LAD 2018 b. 04/09/19 severe single vessel subtotal occlusion OM2 no suitable for PCI, patent LAD stent, mild-mod nonobstructive LCx, RCA  . Diabetes mellitus without complication (HCC)   . ED (erectile dysfunction)   . GERD (gastroesophageal reflux disease)   . Hyperlipidemia LDL goal <70   . Hypertension   . Insomnia   . Lumbar herniated disc    L3 herniated disc 2015  . Mood disorder [name])     Past Surgical History:  Procedure Laterality Date  . LEFT HEART CATH AND CORONARY ANGIOGRAPHY N/A 04/09/2019   Procedure: LEFT HEART CATH AND CORONARY ANGIOGRAPHY;  Surgeon: Yvonne Kendall, MD;  Location: [facility] INVASIVE CV LAB;  Service: Cardiovascular;  Laterality: N/A;  . SHOULDER SURGERY Left 2001   tissue removed from "hood" of shoulder  Current Medications: No outpatient medications have been marked as taking for the 05/28/19 encounter (Appointment) with Sondra Bargesunn, Nahjae Hoeg M, PA-C.     Allergies:   Statins and Plavix [clopidogrel bisulfate]   Social History   Socioeconomic History  . Marital status: Married    Spouse name: Not on file  . Number of children: Not on file  . Years of education: Not on file  . Highest education level: Not on file  Occupational History  . Not on file  Social Needs  .  Financial resource strain: Not on file  . Food insecurity    Worry: Not on file    Inability: Not on file  . Transportation needs    Medical: Not on file    Non-medical: Not on file  Tobacco Use  . Smoking status: Never Smoker  . Smokeless tobacco: Never Used  Substance and Sexual Activity  . Alcohol use: Yes  . Drug use: No  . Sexual activity: Not on file  Lifestyle  . Physical activity    Days per week: Not on file    Minutes per session: Not on file  . Stress: Not on file  Relationships  . Social Musicianconnections    Talks on phone: Not on file    Gets together: Not on file    Attends religious service: Not on file    Active member of club or organization: Not on file    Attends meetings of clubs or organizations: Not on file    Relationship status: Not on file  Other Topics Concern  . Not on file  Social History Narrative  . Not on file     Family History:  The patient's family history includes CAD in his father and mother.  ROS:   ROS   EKGs/Labs/Other Studies Reviewed:    Studies reviewed were summarized above. The additional studies were reviewed today:  LHC 03/2019: Conclusions: 1. Severe single-vessel coronary artery disease with subtotal occlusion of OM2 with TIMI-1 flow.  This vessel is small (less than 2 mm in diameter) and is not well-suited for PCI.  There is mild to moderate, non-obstructive coronary artery disease in the remainder of the LCx and RCA. 2. Widely patent ostial/proximal LAD stent. 3. Mid anterior hypokinesis with otherwise preserved left ventricular systolic function (LVEF 45 to 16%50%). 4. Mildly elevated left ventricular filling pressure.  Recommendations: 1. Medical therapy of NSTEMI, including dual antiplatelet therapy with aspirin and clopidogrel for 12 months. 2. Discontinue nitroglycerin paste and add isosorbide mononitrate 30 mg daily for antianginal therapy. 3. Continue current dose of metoprolol; consider adding ACE inhibitor or ARB  tomorrow, as blood pressure and renal function tolerate. 4. Aggressive secondary prevention, including statin therapy.  If patient is intolerant of low-dose rosuvastatin, PCSK9 inhibitor will need to be considered as an outpatient.  Given marked hypertriglyceridemia, addition of Vascepa for secondary prevention should also be considered. 5. Likely discharge home tomorrow. __________  2D Echo 03/2019: 1. The left ventricle has normal systolic function, with an ejection fraction of 55-60%. The cavity size was normal. Left ventricular diastolic Doppler parameters are consistent with impaired relaxation.  2. The right ventricle has normal systolic function. The cavity was normal. There is no increase in right ventricular wall thickness. Normal RVSP   EKG:  EKG is ordered today.  The EKG ordered today demonstrates ***  Recent Labs: 04/09/2019: TSH 1.446 04/10/2019: ALT 48; BUN 10; Creatinine, Ser 0.52; Hemoglobin 15.2; Platelets 193; Potassium 3.5; Sodium 135  Recent Lipid  Panel    Component Value Date/Time   CHOL 271 (H) 04/10/2019 0523   TRIG 673 (H) 04/10/2019 0523   HDL 28 (L) 04/10/2019 0523   CHOLHDL 9.7 04/10/2019 0523   VLDL UNABLE TO CALCULATE IF TRIGLYCERIDE OVER 400 mg/dL 04/10/2019 0523   LDLCALC UNABLE TO CALCULATE IF TRIGLYCERIDE OVER 400 mg/dL 04/10/2019 0523   LDLDIRECT 124.6 (H) 04/10/2019 0523    PHYSICAL EXAM:    VS:  There were no vitals taken for this visit.  BMI: There is no height or weight on file to calculate BMI.  Physical Exam  Wt Readings from Last 3 Encounters:  04/22/19 185 lb 12 oz (84.3 kg)  04/09/19 184 lb (83.5 kg)  03/13/17 183 lb (83 kg)     ASSESSMENT & PLAN:   1. ***  Disposition: F/u with Dr. Rockey Situ or an APP in ***.   Medication Adjustments/Labs and Tests Ordered: Current medicines are reviewed at length with the patient today.  Concerns regarding medicines are outlined above. Medication changes, Labs and Tests ordered today are  summarized above and listed in the Patient Instructions accessible in Encounters.   Signed, Christell Faith, PA-C 05/26/2019 11:19 AM     [address] [place], [place] [phone]

## 2019-05-28 ENCOUNTER — Ambulatory Visit: Payer: Commercial Managed Care - PPO | Admitting: Physician Assistant

## 2019-07-27 ENCOUNTER — Other Ambulatory Visit: Payer: Self-pay | Admitting: Cardiovascular Disease

## 2020-03-03 ENCOUNTER — Other Ambulatory Visit: Payer: Self-pay

## 2020-03-03 ENCOUNTER — Ambulatory Visit
Admission: EM | Admit: 2020-03-03 | Discharge: 2020-03-03 | Disposition: A | Discharge status: Home / Self Care | Payer: Commercial Managed Care - PPO | Attending: Emergency Medicine | Admitting: Emergency Medicine

## 2020-10-13 IMAGING — DX PORTABLE CHEST - 1 VIEW
1 series · 1 of 1 positions shown · non-contrast
Comparison: 12/20/2015

CLINICAL DATA: Chest pain

EXAM:
PORTABLE CHEST 1 VIEW

[chest ap]
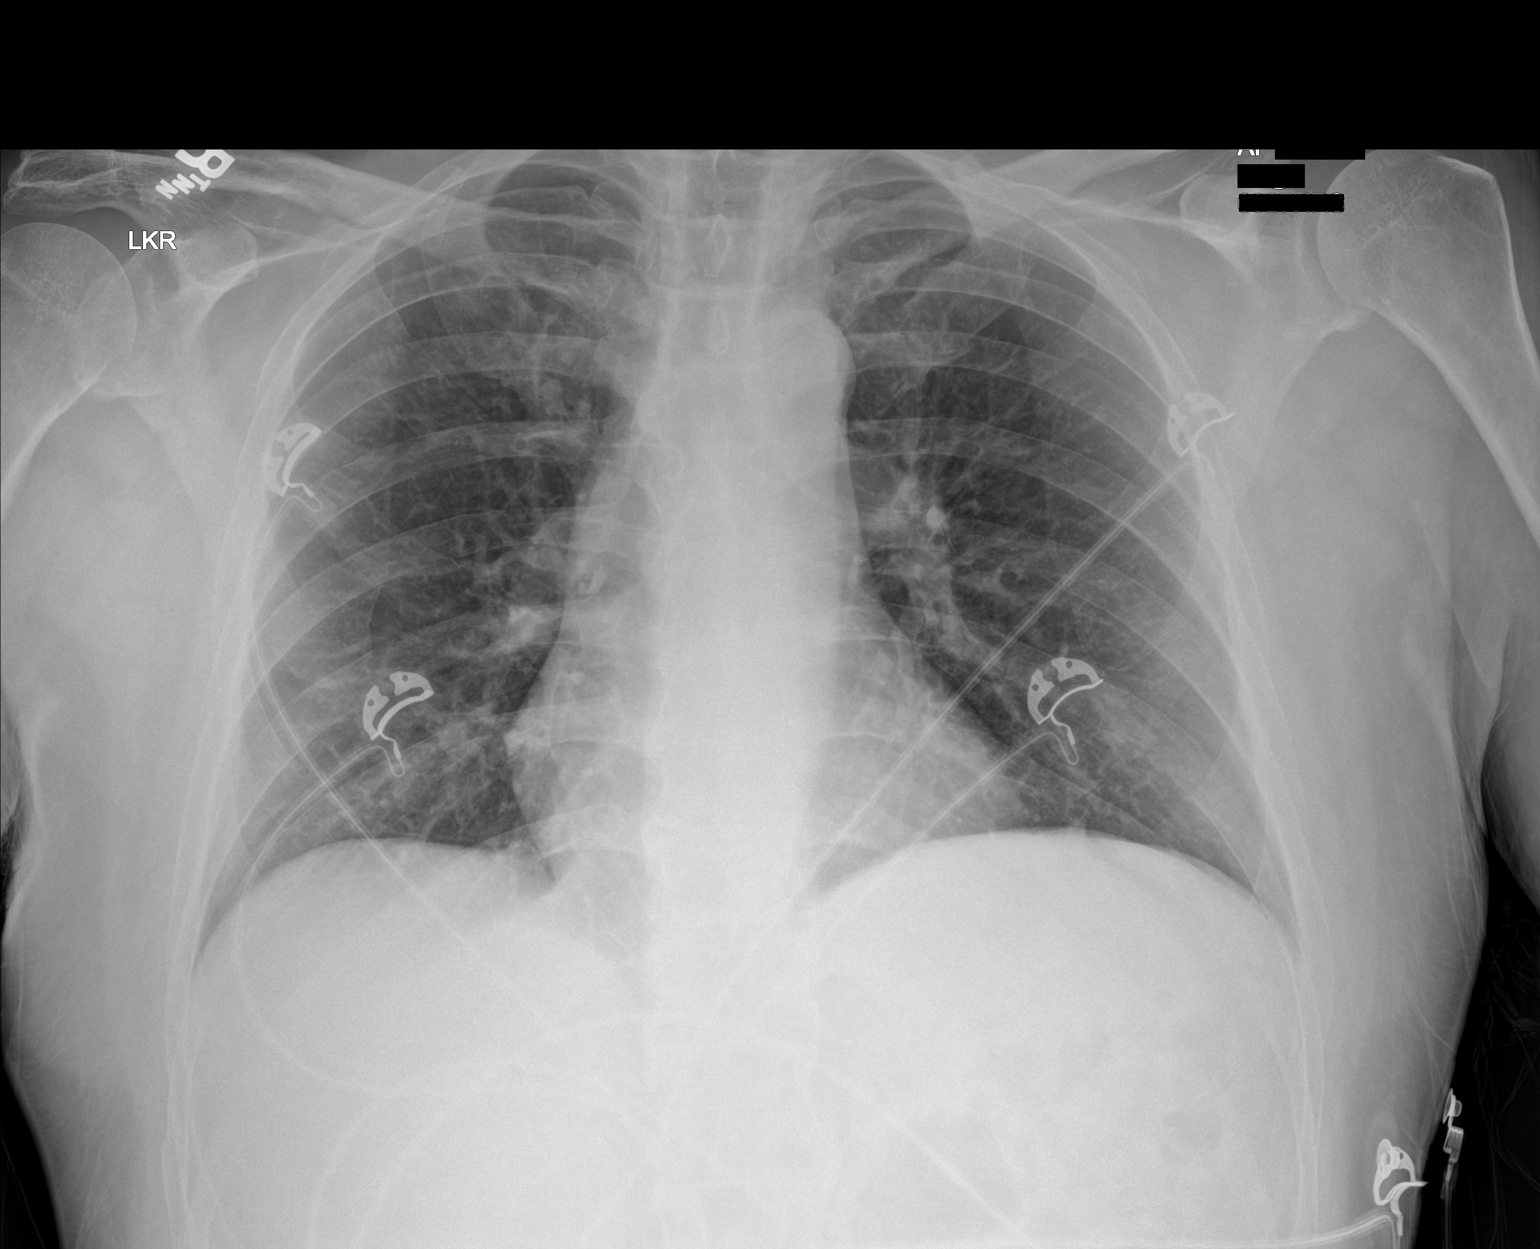

[1 of 1 positions shown; findings below may reference images not displayed]

FINDINGS: Normal heart size and mediastinal contours. No acute infiltrate or
edema. No effusion or pneumothorax. No acute osseous findings.
IMPRESSION: Negative chest.
